# Patient Record
Sex: Female | Born: 1990 | Race: Black or African American | Hispanic: No | Marital: Single | Smoking: Never smoker
Health system: Southern US, Community
[De-identification: ages and names within clinical notes are randomized; demographics above are authoritative.]

## PROBLEM LIST (undated history)

## (undated) DIAGNOSIS — J45909 Unspecified asthma, uncomplicated: Secondary | ICD-10-CM

## (undated) HISTORY — PX: TONSILLECTOMY: SUR1361

---
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
## 2012-03-14 ENCOUNTER — Emergency Department (HOSPITAL_COMMUNITY)
Admission: EM | Admit: 2012-03-14 | Discharge: 2012-03-14 | Disposition: A | Discharge status: Home / Self Care | Payer: Medicaid Other | Attending: Emergency Medicine | Admitting: Emergency Medicine

## 2012-03-14 DIAGNOSIS — N390 Urinary tract infection, site not specified: Secondary | ICD-10-CM | POA: Insufficient documentation

## 2012-03-14 DIAGNOSIS — R109 Unspecified abdominal pain: Secondary | ICD-10-CM

## 2012-03-14 LAB — URINALYSIS, ROUTINE W REFLEX MICROSCOPIC
Bilirubin Urine: NEGATIVE
Glucose, UA: NEGATIVE mg/dL
Hgb urine dipstick: NEGATIVE
Specific Gravity, Urine: 1.021 (ref 1.005–1.030)
Urobilinogen, UA: 1 mg/dL (ref 0.0–1.0)

## 2012-03-14 LAB — URINE MICROSCOPIC-ADD ON

## 2012-03-14 LAB — POCT PREGNANCY, URINE: Preg Test, Ur: NEGATIVE

## 2012-03-14 MED ORDER — CEPHALEXIN 500 MG PO CAPS
500.0000 mg | ORAL_CAPSULE | Freq: Once | ORAL | Status: AC
Start: 2012-03-14 — End: 2012-03-14
  Administered 2012-03-14: 500 mg via ORAL
  Filled 2012-03-14: qty 1

## 2012-03-14 MED ORDER — HYDROCODONE-ACETAMINOPHEN 5-325 MG PO TABS: 1.0000 | ORAL_TABLET | ORAL | Status: AC | PRN

## 2012-03-14 MED ORDER — OXYCODONE-ACETAMINOPHEN 5-325 MG PO TABS
1.0000 | ORAL_TABLET | Freq: Once | ORAL | Status: AC
  Administered 2012-03-14: 1 via ORAL
  Filled 2012-03-14: qty 1

## 2012-03-14 MED ORDER — CEPHALEXIN 500 MG PO CAPS: 500.0000 mg | ORAL_CAPSULE | Freq: Four times a day (QID) | ORAL | Status: AC

## 2012-03-14 NOTE — ED Provider Notes (Signed)
History     CSN: [id number]  Arrival date & time 03/14/12  0224   First MD Initiated Contact with Patient 03/14/12 0319      Chief Complaint  Patient presents with  . Abdominal Pain    The history is provided by the patient.   patient reports approximately 7 days of suprapubic abdominal pain with urinary frequency.  She denies dysuria.  She has no flank pain.  She denies fevers or chills.  She has no nausea vomiting or diarrhea.  She reports her pain is been constant and moderate in severity.  Nothing worsens or improves her symptoms.  She denies vaginal discharge or abnormal vaginal bleeding.  Her last menstrual period was approximately one month ago.  She is unsure if she is pregnant or not.  No past medical history on file.  No past surgical history on file.  No family history on file.  History  Substance Use Topics  . Smoking status: Not on file  . Smokeless tobacco: Not on file  . Alcohol Use: Not on file    OB History    No data available      Review of Systems  Gastrointestinal: Positive for abdominal pain.  All other systems reviewed and are negative.    Allergies  Review of patient's allergies indicates no known allergies.  Home Medications   Current Outpatient Rx  Name Route Sig Dispense Refill  . ALBUTEROL SULFATE HFA 108 (90 BASE) MCG/ACT IN AERS Inhalation Inhale 2 puffs into the lungs every 6 (six) hours as needed. Shortness of breath    . CETIRIZINE HCL 10 MG PO TABS Oral Take 10 mg by mouth daily.    . CEPHALEXIN 500 MG PO CAPS Oral Take 1 capsule (500 mg total) by mouth 4 (four) times daily. 20 capsule 0  . HYDROCODONE-ACETAMINOPHEN 5-325 MG PO TABS Oral Take 1 tablet by mouth every 4 (four) hours as needed for pain. 15 tablet 0    BP 121/72  Pulse 84  Temp 98.2 F (36.8 C) (Oral)  Resp 16  Ht 5\' 3"  (1.6 m)  Wt 170 lb (77.111 kg)  BMI 30.11 kg/m2  SpO2 98%  Physical Exam  Nursing note and vitals reviewed. Constitutional: She is  oriented to person, place, and time. She appears well-developed and well-nourished. No distress.  HENT:  Head: Normocephalic and atraumatic.  Eyes: EOM are normal.  Neck: Normal range of motion.  Cardiovascular: Normal rate, regular rhythm and normal heart sounds.   Pulmonary/Chest: Effort normal and breath sounds normal.  Abdominal: Soft. She exhibits no distension. There is no tenderness. There is no rebound and no guarding.  Musculoskeletal: Normal range of motion.  Neurological: She is alert and oriented to person, place, and time.  Skin: Skin is warm and dry.  Psychiatric: She has a normal mood and affect. Judgment normal.    ED Course  Procedures (including critical care time)  Labs Reviewed  URINALYSIS, ROUTINE W REFLEX MICROSCOPIC - Abnormal; Notable for the following:    APPearance CLOUDY (*)     Leukocytes, UA SMALL (*)     All other components within normal limits  URINE MICROSCOPIC-ADD ON - Abnormal; Notable for the following:    Squamous Epithelial / LPF FEW (*)     All other components within normal limits  POCT PREGNANCY, URINE  URINE CULTURE   No results found.   1. Abdominal pain   2. Urinary tract infection       MDM  The patient is well-appearing.  She is nontender on exam at this time.  Her urine is cloudy with some leukocytes and a few bacteria.  This may represent urinary tract infection as the patient will be covered with Keflex.  She understands return the ER in 48 hours for recheck of her abdomen.  Her pain is been going on for 1 week and she's not tender in her right lower quadrant she has no fever.  I doubt this is appendicitis.        Lyanne Co, MD 03/14/12 [phone]

## 2012-03-14 NOTE — ED Notes (Signed)
WUJ:[id number]<JY> Expected date:03/14/12<BR> Expected time: 2:01 AM<BR> Means of arrival:Ambulance<BR> Comments:<BR> abd pain

## 2012-03-14 NOTE — ED Notes (Signed)
Complaints of abdominal pain that worsened within the past week. Pain goes from the left to right side. Pt states she just had her period and does not know she is pregnant.

## 2012-03-15 LAB — URINE CULTURE

## 2012-03-28 ENCOUNTER — Encounter (HOSPITAL_COMMUNITY): Payer: Self-pay | Admitting: Family Medicine

## 2012-03-28 ENCOUNTER — Emergency Department (HOSPITAL_COMMUNITY): Payer: Medicaid Other

## 2012-03-28 ENCOUNTER — Emergency Department (HOSPITAL_COMMUNITY)
Admission: EM | Admit: 2012-03-28 | Discharge: 2012-03-28 | Discharge status: Against Medical Advice | Payer: Medicaid Other | Attending: Emergency Medicine | Admitting: Emergency Medicine

## 2012-03-28 DIAGNOSIS — R109 Unspecified abdominal pain: Secondary | ICD-10-CM | POA: Insufficient documentation

## 2012-03-28 HISTORY — DX: Unspecified asthma, uncomplicated: J45.909

## 2012-03-28 LAB — URINE MICROSCOPIC-ADD ON

## 2012-03-28 LAB — URINALYSIS, ROUTINE W REFLEX MICROSCOPIC
Bilirubin Urine: NEGATIVE
Glucose, UA: NEGATIVE mg/dL
Hgb urine dipstick: NEGATIVE
Specific Gravity, Urine: 1.026 (ref 1.005–1.030)
Urobilinogen, UA: 2 mg/dL — ABNORMAL HIGH (ref 0.0–1.0)
pH: 6 (ref 5.0–8.0)

## 2012-03-28 LAB — POCT PREGNANCY, URINE: Preg Test, Ur: NEGATIVE

## 2012-03-28 NOTE — ED Notes (Signed)
Pt states she had to leave to pick up her son and she would come back later. NAD noted.

## 2012-03-28 NOTE — ED Notes (Signed)
Patient states that she has been having pain underneath her breasts x 2 days. States that she has taken Hydrocodone without relief. Pain with deep inspiration.

## 2012-06-19 ENCOUNTER — Emergency Department (HOSPITAL_COMMUNITY)
Admission: EM | Admit: 2012-06-19 | Discharge: 2012-06-19 | Disposition: A | Discharge status: Home / Self Care | Payer: Medicaid Other | Attending: Emergency Medicine | Admitting: Emergency Medicine

## 2012-06-19 DIAGNOSIS — B009 Herpesviral infection, unspecified: Secondary | ICD-10-CM | POA: Insufficient documentation

## 2012-06-19 DIAGNOSIS — Z79899 Other long term (current) drug therapy: Secondary | ICD-10-CM | POA: Insufficient documentation

## 2012-06-19 DIAGNOSIS — Z3202 Encounter for pregnancy test, result negative: Secondary | ICD-10-CM | POA: Insufficient documentation

## 2012-06-19 DIAGNOSIS — B001 Herpesviral vesicular dermatitis: Secondary | ICD-10-CM

## 2012-06-19 DIAGNOSIS — J45909 Unspecified asthma, uncomplicated: Secondary | ICD-10-CM | POA: Insufficient documentation

## 2012-06-19 MED ORDER — IBUPROFEN 800 MG PO TABS: 800.0000 mg | ORAL_TABLET | Freq: Three times a day (TID) | ORAL | Status: DC | PRN

## 2012-06-19 MED ORDER — VALACYCLOVIR HCL 1 G PO TABS: 2000.0000 mg | ORAL_TABLET | Freq: Two times a day (BID) | ORAL | Status: AC

## 2012-06-19 MED ORDER — IBUPROFEN 800 MG PO TABS
800.0000 mg | ORAL_TABLET | Freq: Once | ORAL | Status: AC
  Administered 2012-06-19: 800 mg via ORAL
  Filled 2012-06-19: qty 1

## 2012-06-19 MED ORDER — MUPIROCIN CALCIUM 2 % EX CREA: TOPICAL_CREAM | Freq: Three times a day (TID) | CUTANEOUS | Status: DC

## 2012-06-19 NOTE — ED Provider Notes (Signed)
Medical screening examination/treatment/procedure(s) were performed by non-physician practitioner and as supervising physician I was immediately available for consultation/collaboration.   Adaiah Jaskot, MD 06/19/12 2217 

## 2012-06-19 NOTE — ED Notes (Addendum)
Pt states that she began having a rash/lesion on her lip two days ago. Pt states that the lesion is painful. Pt also states that she has not had a period since September.

## 2012-06-19 NOTE — ED Provider Notes (Signed)
History     CSN: [id number]  Arrival date & time 06/19/12  1554   First MD Initiated Contact with Patient 06/19/12 1751      Chief Complaint  Patient presents with  . Rash on Lip     (Consider location/radiation/quality/duration/timing/severity/associated sxs/prior treatment) HPI The patient presents to the ED with a 3 day history of an oral rash.  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  She describes the rash as painfulDenies fever, congestion, sore throat, sinus pain, cough. Denies new oral sexual partners.  Past Medical History  Diagnosis Date  . Asthma     Past Surgical History  Procedure Date  . Tonsillectomy     No family history on file.  History  Substance Use Topics  . Smoking status: Never Smoker   . Smokeless tobacco: Not on file  . Alcohol Use: No    OB History    Grav Para Term Preterm Abortions TAB SAB Ect Mult Living                  Review of Systems All other systems negative except as documented in the HPI. All pertinent positives and negatives as reviewed in the HPI.  Allergies  Review of patient's allergies indicates no known allergies.  Home Medications   Current Outpatient Rx  Name  Route  Sig  Dispense  Refill  . ALBUTEROL SULFATE HFA 108 (90 BASE) MCG/ACT IN AERS   Inhalation   Inhale 2 puffs into the lungs every 6 (six) hours as needed. Shortness of breath         . CETIRIZINE HCL 10 MG PO TABS   Oral   Take 10 mg by mouth daily.           BP 125/68  Pulse 78  Temp 97.8 F (36.6 C) (Oral)  Resp 12  SpO2 97%  LMP 04/03/2012  Physical Exam  HENT:  Mouth/Throat: Mucous membranes are not pale and not dry. Oral lesions present.         1.5cmx1.5cm circular ulcerated lesion R [place] with a 0.5cmx 0.5cm cental scab.  Smaller ulcerated lesion on R corner.  Tender to palpation.      ED Course  Procedures (including critical care time)   Labs Reviewed  PREGNANCY, URINE  Discussed OTC oral HSV  treatments with patient.    MDM         Carlyle Dolly, PA-C 06/19/12 1838

## 2012-08-10 ENCOUNTER — Emergency Department (INDEPENDENT_AMBULATORY_CARE_PROVIDER_SITE_OTHER)
Admission: EM | Admit: 2012-08-10 | Discharge: 2012-08-10 | Disposition: A | Discharge status: Home / Self Care | Payer: Medicaid Other | Source: Home / Self Care | Attending: Emergency Medicine | Admitting: Emergency Medicine

## 2012-08-10 ENCOUNTER — Encounter (HOSPITAL_COMMUNITY): Payer: Self-pay | Admitting: Emergency Medicine

## 2012-08-10 DIAGNOSIS — K529 Noninfective gastroenteritis and colitis, unspecified: Secondary | ICD-10-CM

## 2012-08-10 DIAGNOSIS — N912 Amenorrhea, unspecified: Secondary | ICD-10-CM

## 2012-08-10 DIAGNOSIS — K5289 Other specified noninfective gastroenteritis and colitis: Secondary | ICD-10-CM

## 2012-08-10 LAB — CBC WITH DIFFERENTIAL/PLATELET
Basophils Absolute: 0 10*3/uL (ref 0.0–0.1)
Basophils Relative: 0 % (ref 0–1)
Eosinophils Absolute: 0.1 10*3/uL (ref 0.0–0.7)
Hemoglobin: 13.4 g/dL (ref 12.0–15.0)
MCH: 29.2 pg (ref 26.0–34.0)
MCHC: 33.5 g/dL (ref 30.0–36.0)
Monocytes Relative: 7 % (ref 3–12)
Neutro Abs: 3.2 10*3/uL (ref 1.7–7.7)
Neutrophils Relative %: 45 % (ref 43–77)
Platelets: 203 10*3/uL (ref 150–400)

## 2012-08-10 LAB — POCT URINALYSIS DIP (DEVICE)
Glucose, UA: NEGATIVE mg/dL
Ketones, ur: NEGATIVE mg/dL
Nitrite: NEGATIVE

## 2012-08-10 LAB — POCT PREGNANCY, URINE: Preg Test, Ur: NEGATIVE

## 2012-08-10 MED ORDER — GI COCKTAIL ~~LOC~~
ORAL | Status: AC
  Filled 2012-08-10: qty 30

## 2012-08-10 MED ORDER — GI COCKTAIL ~~LOC~~
30.0000 mL | Freq: Once | ORAL | Status: AC
  Administered 2012-08-10: 30 mL via ORAL

## 2012-08-10 MED ORDER — ONDANSETRON HCL 8 MG PO TABS: 8.0000 mg | ORAL_TABLET | Freq: Three times a day (TID) | ORAL | Status: DC | PRN

## 2012-08-10 MED ORDER — DICYCLOMINE HCL 20 MG PO TABS: 20.0000 mg | ORAL_TABLET | Freq: Four times a day (QID) | ORAL | Status: DC

## 2012-08-10 NOTE — ED Provider Notes (Signed)
Chief Complaint  Patient presents with  . Abdominal Pain    History of Present Illness:   The patient is a 22 year old female who has had a two-day history of generalized, moderate abdominal pain. This feels like a stabbing sensation and it comes and goes. It does not radiate. Nothing including eating makes it worse. It was better when she drank some Sprite. It is rated 5/10 in intensity. She's felt nauseated and vomited a couple times and had frequent loose stools, about 15 at all since yesterday. There's been no blood in the stool, no urinary symptoms, no GYN complaints. Her mother had a similar syndrome prior to her getting sick. She's had no suspicious ingestions, foreign travel, or animal exposure. Her last menstrual period was in June 2013. The patient is sexually active without use of birth control. She states her menses always been irregular.  Review of Systems:  Other than noted above, the patient denies any of the following symptoms: Systemic:  No fevers, chills, sweats, weight loss or gain, fatigue, or tiredness. ENT:  No nasal congestion, rhinorrhea, or sore throat. Lungs:  No cough, wheezing, or shortness of breath. Cardiac:  No chest pain, syncope, or presyncope. GI:  No abdominal pain, nausea, vomiting, anorexia, diarrhea, constipation, blood in stool or vomitus. GU:  No dysuria, frequency, or urgency.  PMFSH:  Past medical history, family history, social history, meds, and allergies were reviewed.  Physical Exam:   Vital signs:  BP 103/68  Pulse 86  Temp 97.6 F (36.4 C) (Oral)  Resp 16  SpO2 98%  LMP 12/31/2011 General:  Alert and oriented.  In no distress.  Skin warm and dry.  Good skin turgor, brisk capillary refill. ENT:  No scleral icterus, moist mucous membranes, no oral lesions, pharynx clear. Lungs:  Breath sounds clear and equal bilaterally.  No wheezes, rales, or rhonchi. Heart:  Rhythm regular, without extrasystoles.  No gallops or murmers. Abdomen:  Abdomen  was soft, flat, with mild, generalized tenderness to palpation without guarding or rebound. No organomegaly or mass. Bowel sounds are hyperactive. Skin: Clear, warm, and dry.  Good turgor.  Brisk capillary refill.  Labs:   Results for orders placed during the hospital encounter of 08/10/12  CBC WITH DIFFERENTIAL      Component Value Range   WBC 7.0  4.0 - 10.5 K/uL   RBC 4.59  3.87 - 5.11 MIL/uL   Hemoglobin 13.4  12.0 - 15.0 g/dL   HCT 40.9  81.1 - 91.4 %   MCV 87.1  78.0 - 100.0 fL   MCH 29.2  26.0 - 34.0 pg   MCHC 33.5  30.0 - 36.0 g/dL   RDW 78.2  95.6 - 21.3 %   Platelets 203  150 - 400 K/uL   Neutrophils Relative 45  43 - 77 %   Neutro Abs 3.2  1.7 - 7.7 K/uL   Lymphocytes Relative 46  12 - 46 %   Lymphs Abs 3.2  0.7 - 4.0 K/uL   Monocytes Relative 7  3 - 12 %   Monocytes Absolute 0.5  0.1 - 1.0 K/uL   Eosinophils Relative 2  0 - 5 %   Eosinophils Absolute 0.1  0.0 - 0.7 K/uL   Basophils Relative 0  0 - 1 %   Basophils Absolute 0.0  0.0 - 0.1 K/uL  POCT URINALYSIS DIP (DEVICE)      Component Value Range   Glucose, UA NEGATIVE  NEGATIVE mg/dL   Bilirubin Urine SMALL (*)  NEGATIVE   Ketones, ur NEGATIVE  NEGATIVE mg/dL   Specific Gravity, Urine 1.025  1.005 - 1.030   Hgb urine dipstick NEGATIVE  NEGATIVE   pH 6.5  5.0 - 8.0   Protein, ur 30 (*) NEGATIVE mg/dL   Urobilinogen, UA 1.0  0.0 - 1.0 mg/dL   Nitrite NEGATIVE  NEGATIVE   Leukocytes, UA TRACE (*) NEGATIVE  POCT PREGNANCY, URINE      Component Value Range   Preg Test, Ur NEGATIVE  NEGATIVE     Course in Urgent Care Center:   She was given 30 mL of GI cocktail and experienced improvement of the pain after that. She did not have any nausea or vomiting while she was at the Urgent Care Center.  Assessment:  The primary encounter diagnosis was Gastroenteritis. A diagnosis of Amenorrhea was also pertinent to this visit.  Plan:   1.  The following meds were prescribed:   New Prescriptions   DICYCLOMINE (BENTYL) 20  MG TABLET    Take 1 tablet (20 mg total) by mouth every 6 (six) hours.   ONDANSETRON (ZOFRAN) 8 MG TABLET    Take 1 tablet (8 mg total) by mouth every 8 (eight) hours as needed for nausea.   2.  The patient was instructed in symptomatic care and handouts were given. 3.  The patient was told to return if becoming worse in any way, if no better in 2 or 3 days, and given some red flag symptoms that would indicate earlier return. 4.  The patient was told to take only sips of clear liquids for the next 24 hours and then advance to a b.r.a.t. Diet.      Reuben Likes, MD 08/10/12 [phone]

## 2012-08-10 NOTE — ED Notes (Signed)
Pt c/o abd pain since yest night Sx include: diarrhea and vomiting Denies: fevers, nauseas  She is alert and responsive w/no signs of acute distress.

## 2012-10-01 ENCOUNTER — Encounter (HOSPITAL_COMMUNITY): Payer: Self-pay | Admitting: Emergency Medicine

## 2012-10-01 ENCOUNTER — Emergency Department (HOSPITAL_COMMUNITY): Payer: Medicaid Other

## 2012-10-01 ENCOUNTER — Emergency Department (HOSPITAL_COMMUNITY)
Admission: EM | Admit: 2012-10-01 | Discharge: 2012-10-02 | Disposition: A | Discharge status: Home / Self Care | Payer: Medicaid Other | Attending: Emergency Medicine | Admitting: Emergency Medicine

## 2012-10-01 DIAGNOSIS — R6883 Chills (without fever): Secondary | ICD-10-CM | POA: Insufficient documentation

## 2012-10-01 DIAGNOSIS — R1110 Vomiting, unspecified: Secondary | ICD-10-CM | POA: Insufficient documentation

## 2012-10-01 DIAGNOSIS — Z3202 Encounter for pregnancy test, result negative: Secondary | ICD-10-CM | POA: Insufficient documentation

## 2012-10-01 DIAGNOSIS — J159 Unspecified bacterial pneumonia: Secondary | ICD-10-CM | POA: Insufficient documentation

## 2012-10-01 DIAGNOSIS — J189 Pneumonia, unspecified organism: Secondary | ICD-10-CM

## 2012-10-01 DIAGNOSIS — Z79899 Other long term (current) drug therapy: Secondary | ICD-10-CM | POA: Insufficient documentation

## 2012-10-01 DIAGNOSIS — J45909 Unspecified asthma, uncomplicated: Secondary | ICD-10-CM | POA: Insufficient documentation

## 2012-10-01 DIAGNOSIS — IMO0001 Reserved for inherently not codable concepts without codable children: Secondary | ICD-10-CM | POA: Insufficient documentation

## 2012-10-01 LAB — COMPREHENSIVE METABOLIC PANEL
ALT: 15 U/L (ref 0–35)
AST: 14 U/L (ref 0–37)
Alkaline Phosphatase: 50 U/L (ref 39–117)
CO2: 25 mEq/L (ref 19–32)
Calcium: 9 mg/dL (ref 8.4–10.5)
Potassium: 3.9 mEq/L (ref 3.5–5.1)
Sodium: 138 mEq/L (ref 135–145)
Total Protein: 8.9 g/dL — ABNORMAL HIGH (ref 6.0–8.3)

## 2012-10-01 LAB — CBC WITH DIFFERENTIAL/PLATELET
Basophils Absolute: 0 10*3/uL (ref 0.0–0.1)
Eosinophils Absolute: 0.3 10*3/uL (ref 0.0–0.7)
Eosinophils Relative: 3 % (ref 0–5)
Lymphocytes Relative: 37 % (ref 12–46)
Lymphs Abs: 3.9 10*3/uL (ref 0.7–4.0)
MCV: 87 fL (ref 78.0–100.0)
Neutrophils Relative %: 54 % (ref 43–77)
Platelets: 194 10*3/uL (ref 150–400)
RBC: 4.38 MIL/uL (ref 3.87–5.11)
RDW: 13 % (ref 11.5–15.5)
WBC: 10.6 10*3/uL — ABNORMAL HIGH (ref 4.0–10.5)

## 2012-10-01 LAB — URINE MICROSCOPIC-ADD ON

## 2012-10-01 LAB — URINALYSIS, ROUTINE W REFLEX MICROSCOPIC
Hgb urine dipstick: NEGATIVE
Nitrite: NEGATIVE
Protein, ur: NEGATIVE mg/dL
Specific Gravity, Urine: 1.034 — ABNORMAL HIGH (ref 1.005–1.030)
Urobilinogen, UA: 2 mg/dL — ABNORMAL HIGH (ref 0.0–1.0)

## 2012-10-01 MED ORDER — ALBUTEROL SULFATE (5 MG/ML) 0.5% IN NEBU
2.5000 mg | INHALATION_SOLUTION | RESPIRATORY_TRACT | Status: DC
  Administered 2012-10-01: 2.5 mg via RESPIRATORY_TRACT
  Filled 2012-10-01: qty 0.5

## 2012-10-01 MED ORDER — IPRATROPIUM BROMIDE 0.02 % IN SOLN
0.5000 mg | RESPIRATORY_TRACT | Status: DC
  Administered 2012-10-01: 0.5 mg via RESPIRATORY_TRACT
  Filled 2012-10-01: qty 2.5

## 2012-10-01 NOTE — ED Notes (Signed)
Pt c/o productive cough (green) with pain in her chest when she coughs.  Also st's she has vomited x's 1 today.

## 2012-10-01 NOTE — ED Notes (Signed)
Patient transported to X-ray 

## 2012-10-01 NOTE — ED Provider Notes (Signed)
History     CSN: [id number]  Arrival date & time 10/01/12  2215   First MD Initiated Contact with Patient 10/01/12 2241      Chief Complaint  Patient presents with  . Cough    (Consider location/radiation/quality/duration/timing/severity/associated sxs/prior treatment) HPI  22 year old female presents emergency Department with chief complaint of productive cough and one episode of posttussive vomiting today.patient states she has had cough with production of green sputum for the past 3 days.  She's also had associated myalgias and chills.  She has not had a fever.  The patient denies any contacts with similar symptoms.  He does have a distant history of asthma with hospitalization but denies history of intubation.  Patient states that she has pain with deep inhalation anticoagulation.  She has had some wheezing and has not had an albuterol inhaler for several years or needed one.  The patient is a nonsmoker  Past Medical History  Diagnosis Date  . Asthma     Past Surgical History  Procedure Laterality Date  . Tonsillectomy      No family history on file.  History  Substance Use Topics  . Smoking status: Never Smoker   . Smokeless tobacco: Not on file  . Alcohol Use: No    OB History   Grav Para Term Preterm Abortions TAB SAB Ect Mult Living                  Review of Systems Ten systems reviewed and are negative for acute change, except as noted in the HPI.   Allergies  Review of patient's allergies indicates no known allergies.  Home Medications   Current Outpatient Rx  Name  Route  Sig  Dispense  Refill  . albuterol (PROVENTIL HFA;VENTOLIN HFA) 108 (90 BASE) MCG/ACT inhaler   Inhalation   Inhale 2 puffs into the lungs every 6 (six) hours as needed. Shortness of breath         . cetirizine (ZYRTEC) 10 MG tablet   Oral   Take 10 mg by mouth daily.           BP 118/68  Pulse 85  Temp(Src) 98.3 F (36.8 C) (Oral)  Resp 18  SpO2 97%  LMP  08/22/2012  Physical Exam Physical Exam  Nursing note and vitals reviewed. Constitutional: She is oriented to person, place, and time. She appears well-developed and well-nourished. She appears ill. HENT:  Head: Normocephalic and atraumatic.  Eyes: Conjunctivae normal and EOM are normal. Pupils are equal, round, and reactive to light. No scleral icterus.  Neck: Normal range of motion. No cervical adenopathy Cardiovascular: Normal rate, regular rhythm and normal heart sounds.  Exam reveals no gallop and no friction rub.   No murmur heard. Pulmonary/Chest: Effort normal. Paroxysms of cough.  Production purulent sputum.  Wheezing and increased breath sounds over the right upper lobe. Abdominal: Soft. Bowel sounds are normal. She exhibits no distension and no mass. There is no tenderness. There is no guarding.  Neurological: She is alert and oriented to person, place, and time.  Skin: Skin is warm and dry. She is not diaphoretic.    ED Course  Procedures (including critical care time)  Labs Reviewed  CBC WITH DIFFERENTIAL - Abnormal; Notable for the following:    WBC 10.6 (*)    All other components within normal limits  URINALYSIS, ROUTINE W REFLEX MICROSCOPIC  COMPREHENSIVE METABOLIC PANEL  POCT PREGNANCY, URINE   Dg Chest 2 View  10/01/2012  *RADIOLOGY REPORT*  Clinical Data: Short of breath, vomiting  CHEST - 2 VIEW  Comparison: Chest radiograph 03/20/2012  Findings: Normal cardiac silhouette.  There is new air space disease in the central right upper lobe.  No pleural fluid.  No pneumothorax.  IMPRESSION: Right upper lobe pneumonia.  Recommend follow-up chest radiograph to ensure resolution.   Original Report Authenticated By: Genevive Bi, M.D.      1. CAP (community acquired pneumonia)       MDM  11:33 PM BP 118/68  Pulse 85  Temp(Src) 98.3 F (36.8 C) (Oral)  Resp 18  SpO2 97%  LMP 08/22/2012 Patient with chest x-ray that shows right upper lobe pneumonia.  Other  labs are currently pending.  Patient does have some wheezing.  We'll treat with duo neb.   12:02 AM BP 118/68  Pulse 85  Temp(Src) 98.3 F (36.8 C) (Oral)  Resp 18  SpO2 97%  LMP 08/22/2012 Patient labs resulted.  Patient has elevated preoteinemia and leukocytosis.  Will d/c with oral abx for CAP treatment.  Curb-65 score is zero. Patient has been diagnosed with CAP via chest xray. Pt is not ill appearing, immunocompromised, and does not have multiple co morbidities, therefore I feel like the they can be treated as an OP with abx therapy. Pt has been advised to return to the ED if symptoms worsen or they do not improve. Pt verbalizes understanding and is agreeable with plan. Follow up for repeat chest x ray in 7 days,        Arthor Captain, PA-C 10/02/12 0017

## 2012-10-02 MED ORDER — AZITHROMYCIN 250 MG PO TABS: ORAL_TABLET | ORAL | Status: DC

## 2012-10-02 MED ORDER — HYDROCODONE-HOMATROPINE 5-1.5 MG/5ML PO SYRP: 2.5000 mL | ORAL_SOLUTION | Freq: Four times a day (QID) | ORAL | Status: DC | PRN

## 2012-10-02 MED ORDER — ALBUTEROL SULFATE HFA 108 (90 BASE) MCG/ACT IN AERS: 2.0000 | INHALATION_SPRAY | RESPIRATORY_TRACT | Status: DC | PRN

## 2012-10-03 LAB — URINE CULTURE

## 2012-10-03 NOTE — ED Provider Notes (Signed)
Medical screening examination/treatment/procedure(s) were performed by non-physician practitioner and as supervising physician I was immediately available for consultation/collaboration.   Celene Kras, MD 10/03/12 [phone]

## 2012-10-08 ENCOUNTER — Emergency Department (HOSPITAL_COMMUNITY): Payer: Medicaid Other

## 2012-10-08 ENCOUNTER — Emergency Department (HOSPITAL_COMMUNITY)
Admission: EM | Admit: 2012-10-08 | Discharge: 2012-10-09 | Disposition: A | Discharge status: Home / Self Care | Payer: Medicaid Other | Attending: Emergency Medicine | Admitting: Emergency Medicine

## 2012-10-08 ENCOUNTER — Encounter (HOSPITAL_COMMUNITY): Payer: Self-pay | Admitting: *Deleted

## 2012-10-08 DIAGNOSIS — J45909 Unspecified asthma, uncomplicated: Secondary | ICD-10-CM | POA: Insufficient documentation

## 2012-10-08 DIAGNOSIS — R05 Cough: Secondary | ICD-10-CM | POA: Insufficient documentation

## 2012-10-08 DIAGNOSIS — G510 Bell's palsy: Secondary | ICD-10-CM | POA: Insufficient documentation

## 2012-10-08 DIAGNOSIS — Z09 Encounter for follow-up examination after completed treatment for conditions other than malignant neoplasm: Secondary | ICD-10-CM | POA: Insufficient documentation

## 2012-10-08 DIAGNOSIS — Z79899 Other long term (current) drug therapy: Secondary | ICD-10-CM | POA: Insufficient documentation

## 2012-10-08 DIAGNOSIS — R059 Cough, unspecified: Secondary | ICD-10-CM | POA: Insufficient documentation

## 2012-10-08 NOTE — ED Notes (Signed)
Pt is unable to raise left eyebrow and left side mouth not working when smiles

## 2012-10-08 NOTE — ED Provider Notes (Signed)
History     CSN: [id number]  Arrival date & time 10/08/12  1717   First MD Initiated Contact with Patient 10/08/12 2237      Chief Complaint  Patient presents with  . Follow-up    (Consider location/radiation/quality/duration/timing/severity/associated sxs/prior treatment) HPI Comments: 22 y/o female presents to the ED for follow up CXR for her pneumonia diagnosed 7 days ago. She completed course of azithromycin. States her cough is still present however admits to marked improvement. Shortness of breath has improved. Denies fever, chills, nausea, vomiting, chest pain. Friday she began to experience inability to use the left side of her face with associated right sided facial numbness. Feels as if her left lip is "drawn up". Denies extremity weakness, speech changes, confusion, visual changes.   The history is provided by the patient.    Past Medical History  Diagnosis Date  . Asthma     Past Surgical History  Procedure Laterality Date  . Tonsillectomy      No family history on file.  History  Substance Use Topics  . Smoking status: Never Smoker   . Smokeless tobacco: Not on file  . Alcohol Use: No    OB History   Grav Para Term Preterm Abortions TAB SAB Ect Mult Living                  Review of Systems  Constitutional: Negative for fever and chills.  HENT: Negative for neck pain and neck stiffness.   Eyes: Negative for visual disturbance.  Respiratory: Positive for cough.   Cardiovascular: Negative for chest pain.  Gastrointestinal: Negative for nausea and vomiting.  Musculoskeletal: Negative for back pain.  Skin: Negative for color change.  Neurological: Positive for facial asymmetry.  Psychiatric/Behavioral: Negative for confusion.  All other systems reviewed and are negative.    Allergies  Review of patient's allergies indicates no known allergies.  Home Medications   Current Outpatient Rx  Name  Route  Sig  Dispense  Refill  . albuterol  (PROVENTIL HFA;VENTOLIN HFA) 108 (90 BASE) MCG/ACT inhaler   Inhalation   Inhale 2 puffs into the lungs every 6 (six) hours as needed. Shortness of breath           BP 107/59  Pulse 81  Temp(Src) 97.7 F (36.5 C) (Oral)  Resp 14  SpO2 100%  LMP 08/22/2012  Physical Exam  Nursing note and vitals reviewed. Constitutional: She is oriented to person, place, and time. She appears well-developed and well-nourished. No distress.  HENT:  Head: Normocephalic and atraumatic.  Right Ear: Tympanic membrane and ear canal normal.  Left Ear: Tympanic membrane and ear canal normal.  Nose: Nose normal.  Mouth/Throat: Uvula is midline, oropharynx is clear and moist and mucous membranes are normal.  Eyes: Conjunctivae and EOM are normal. Pupils are equal, round, and reactive to light.  Neck: Normal range of motion. Neck supple.  Cardiovascular: Normal rate, regular rhythm, normal heart sounds and intact distal pulses.   Pulmonary/Chest: Effort normal. She has no decreased breath sounds. She has rhonchi (mild ronchi in right upper/mid lung field).  Musculoskeletal: Normal range of motion. She exhibits no edema.  Neurological: She is alert and oriented to person, place, and time. She has normal strength. A cranial nerve deficit (motor CN VII deficit) is present. No sensory deficit.  Skin: Skin is warm and dry. She is not diaphoretic.  Psychiatric: She has a normal mood and affect. Her behavior is normal.    ED Course  Procedures (including critical care time)  Labs Reviewed - No data to display Dg Chest 2 View  10/09/2012  *RADIOLOGY REPORT*  Clinical Data: Follow up pneumonia; history asthma  CHEST - 2 VIEW  Comparison: 10/01/2012  Findings: Normal heart size, mediastinal contours, and pulmonary vascularity. Peribronchial thickening with improved right upper lobe infiltrate. Remaining lungs clear. No pleural effusion or pneumothorax. No acute osseous findings.  IMPRESSION: Bronchitic changes.  Improved right upper lobe infiltrate.   Original Report Authenticated By: Ulyses Southward, M.D.      1. Follow-up examination   2. Bell's palsy       MDM  Repeat CXR with improved RUL infiltrate. She is in NAD. Also with Bell's Palsy. CN VII motor deficit. No other focal neuro deficits. PE otherwise unremarkable. Rx prednisone and acyclovir. Discussed importance of keeping left eye moist and covered. Patient also evaluated by Dr. Silverio Lay who agrees with plan of care. Return precautions discussed. Resource guide given for PCP follow up. Patient states understanding of plan and is agreeable.         Trevor Mace, PA-C 10/09/12 0012

## 2012-10-08 NOTE — ED Notes (Signed)
Pt reports developing right side facial droop for past two days. States that she was diagnosed with PNA 7 days ago. States that she has had some difficulty swallowing and eating due to the right side facial droop. Pt states her mom thought pt could have Bell's Palsy and to come to the ER for further evaluation.

## 2012-10-08 NOTE — ED Notes (Signed)
Patient transported to X-ray 

## 2012-10-08 NOTE — ED Notes (Signed)
Pt here to have 7 day follow up of chest xray for pneumonia and pt states that her right side of  Lips are drawn up over the last 2 days.

## 2012-10-09 MED ORDER — PREDNISONE 20 MG PO TABS
60.0000 mg | ORAL_TABLET | Freq: Once | ORAL | Status: AC
  Administered 2012-10-09: 60 mg via ORAL
  Filled 2012-10-09: qty 3

## 2012-10-09 MED ORDER — ACYCLOVIR 200 MG PO CAPS
400.0000 mg | ORAL_CAPSULE | Freq: Once | ORAL | Status: AC
  Administered 2012-10-09: 400 mg via ORAL
  Filled 2012-10-09 (×2): qty 2

## 2012-10-09 MED ORDER — PREDNISONE 20 MG PO TABS: ORAL_TABLET | ORAL | Status: DC

## 2012-10-09 MED ORDER — ACYCLOVIR 400 MG PO TABS: 400.0000 mg | ORAL_TABLET | Freq: Four times a day (QID) | ORAL | Status: DC

## 2012-10-09 NOTE — ED Provider Notes (Signed)
Medical screening examination/treatment/procedure(s) were conducted as a shared visit with non-physician practitioner(s) and myself.  I personally evaluated the patient during the encounter  Jennifer Thornton is a 22 y.o. female here for f/u CXR for pneumonia last week. She finished Zpack. She also developed bells palsy L face. No rash or nose or ear lesions. She is d/c home on prednisone, acyclovir. She is instructed to keep eye taped or closed at night.    Richardean Canal, MD 10/09/12 [phone]

## 2012-10-09 NOTE — ED Notes (Addendum)
Waiting on medication from the pharmacy before discharging patient. 

## 2013-07-10 ENCOUNTER — Emergency Department (HOSPITAL_COMMUNITY)
Admission: EM | Admit: 2013-07-10 | Discharge: 2013-07-11 | Disposition: A | Discharge status: Home / Self Care | Payer: Medicaid Other | Attending: Emergency Medicine | Admitting: Emergency Medicine

## 2013-07-10 ENCOUNTER — Encounter (HOSPITAL_COMMUNITY): Payer: Self-pay | Admitting: Emergency Medicine

## 2013-07-10 DIAGNOSIS — N898 Other specified noninflammatory disorders of vagina: Secondary | ICD-10-CM | POA: Insufficient documentation

## 2013-07-10 DIAGNOSIS — N739 Female pelvic inflammatory disease, unspecified: Secondary | ICD-10-CM | POA: Insufficient documentation

## 2013-07-10 DIAGNOSIS — A599 Trichomoniasis, unspecified: Secondary | ICD-10-CM | POA: Insufficient documentation

## 2013-07-10 DIAGNOSIS — Z79899 Other long term (current) drug therapy: Secondary | ICD-10-CM | POA: Insufficient documentation

## 2013-07-10 DIAGNOSIS — J45909 Unspecified asthma, uncomplicated: Secondary | ICD-10-CM | POA: Insufficient documentation

## 2013-07-10 DIAGNOSIS — R110 Nausea: Secondary | ICD-10-CM | POA: Insufficient documentation

## 2013-07-10 DIAGNOSIS — Z3202 Encounter for pregnancy test, result negative: Secondary | ICD-10-CM | POA: Insufficient documentation

## 2013-07-10 DIAGNOSIS — N730 Acute parametritis and pelvic cellulitis: Secondary | ICD-10-CM

## 2013-07-10 DIAGNOSIS — Z792 Long term (current) use of antibiotics: Secondary | ICD-10-CM | POA: Insufficient documentation

## 2013-07-10 DIAGNOSIS — Z791 Long term (current) use of non-steroidal anti-inflammatories (NSAID): Secondary | ICD-10-CM | POA: Insufficient documentation

## 2013-07-10 LAB — CBC WITH DIFFERENTIAL/PLATELET
Basophils Absolute: 0 10*3/uL (ref 0.0–0.1)
Basophils Relative: 0 % (ref 0–1)
Eosinophils Absolute: 0.1 10*3/uL (ref 0.0–0.7)
HCT: 38.5 % (ref 36.0–46.0)
Hemoglobin: 13.1 g/dL (ref 12.0–15.0)
MCH: 30.3 pg (ref 26.0–34.0)
MCHC: 34 g/dL (ref 30.0–36.0)
MCV: 88.9 fL (ref 78.0–100.0)
Monocytes Absolute: 0.7 10*3/uL (ref 0.1–1.0)
Monocytes Relative: 6 % (ref 3–12)
Neutro Abs: 6.4 10*3/uL (ref 1.7–7.7)
Platelets: 226 10*3/uL (ref 150–400)

## 2013-07-10 LAB — COMPREHENSIVE METABOLIC PANEL
Albumin: 4 g/dL (ref 3.5–5.2)
BUN: 12 mg/dL (ref 6–23)
Chloride: 102 mEq/L (ref 96–112)
Creatinine, Ser: 0.59 mg/dL (ref 0.50–1.10)
GFR calc non Af Amer: >90 mL/min (ref >90)
Sodium: 137 mEq/L (ref 135–145)
Total Bilirubin: 0.2 mg/dL — ABNORMAL LOW (ref 0.3–1.2)

## 2013-07-10 LAB — URINALYSIS, ROUTINE W REFLEX MICROSCOPIC
Hgb urine dipstick: NEGATIVE
Ketones, ur: NEGATIVE mg/dL
Nitrite: NEGATIVE
Protein, ur: NEGATIVE mg/dL
Urobilinogen, UA: 0.2 mg/dL (ref 0.0–1.0)
pH: 7 (ref 5.0–8.0)

## 2013-07-10 LAB — LIPASE, BLOOD: Lipase: 21 U/L (ref 11–59)

## 2013-07-10 NOTE — ED Notes (Signed)
Pt. reports right / low abdominal pain with nausea and vomitting onset 2 days ago , denies diarrhea/fever or chills. No urinary discomfort or vaginal discharge.

## 2013-07-11 LAB — WET PREP, GENITAL

## 2013-07-11 LAB — GC/CHLAMYDIA PROBE AMP
CT Probe RNA: NEGATIVE
GC Probe RNA: POSITIVE — AB

## 2013-07-11 MED ORDER — AZITHROMYCIN 1 G PO PACK
1.0000 g | PACK | Freq: Once | ORAL | Status: AC
  Administered 2013-07-11: 1 g via ORAL
  Filled 2013-07-11: qty 1

## 2013-07-11 MED ORDER — CEFTRIAXONE SODIUM 250 MG IJ SOLR
250.0000 mg | Freq: Once | INTRAMUSCULAR | Status: AC
  Administered 2013-07-11: 250 mg via INTRAMUSCULAR
  Filled 2013-07-11: qty 250

## 2013-07-11 MED ORDER — METRONIDAZOLE 500 MG PO TABS
2000.0000 mg | ORAL_TABLET | Freq: Once | ORAL | Status: AC
  Administered 2013-07-11: 2000 mg via ORAL
  Filled 2013-07-11: qty 4

## 2013-07-11 MED ORDER — KETOROLAC TROMETHAMINE 60 MG/2ML IM SOLN
60.0000 mg | Freq: Once | INTRAMUSCULAR | Status: AC
  Administered 2013-07-11: 60 mg via INTRAMUSCULAR
  Filled 2013-07-11: qty 2

## 2013-07-11 MED ORDER — DOXYCYCLINE HYCLATE 100 MG PO CAPS: 100.0000 mg | ORAL_CAPSULE | Freq: Two times a day (BID) | ORAL | Status: DC

## 2013-07-11 MED ORDER — NAPROXEN 375 MG PO TABS: 375.0000 mg | ORAL_TABLET | Freq: Two times a day (BID) | ORAL | Status: DC

## 2013-07-11 NOTE — ED Notes (Signed)
Starting Friday she has been having pain to her right side.  Yesterday that pain got worse and she vomited once before coming here.  Denies any vaginal discharge at this time.

## 2013-07-11 NOTE — ED Provider Notes (Signed)
CSN: [id number]     Arrival date & time 07/10/13  2056 History   First MD Initiated Contact with Patient 07/11/13 0107     Chief Complaint  Patient presents with  . Abdominal Pain   (Consider location/radiation/quality/duration/timing/severity/associated sxs/prior Treatment) Patient is a 22 y.o. female presenting with abdominal pain. The history is provided by the patient.  Abdominal Pain Pain location:  Suprapubic Pain quality: aching   Pain radiates to:  Does not radiate Pain severity:  Severe Onset quality:  Gradual Timing:  Constant Progression:  Worsening Chronicity:  New Context: not suspicious food intake and not trauma   Relieved by:  Nothing Worsened by:  Nothing tried Ineffective treatments:  None tried Associated symptoms: nausea   Associated symptoms: no anorexia and no fever   Risk factors: not pregnant     Past Medical History  Diagnosis Date  . Asthma    Past Surgical History  Procedure Laterality Date  . Tonsillectomy     No family history on file. History  Substance Use Topics  . Smoking status: Never Smoker   . Smokeless tobacco: Not on file  . Alcohol Use: No   OB History   Grav Para Term Preterm Abortions TAB SAB Ect Mult Living                 Review of Systems  Constitutional: Negative for fever.  Gastrointestinal: Positive for nausea and abdominal pain. Negative for anorexia.  Genitourinary: Positive for pelvic pain.  All other systems reviewed and are negative.    Allergies  Review of patient's allergies indicates no known allergies.  Home Medications   Current Outpatient Rx  Name  Route  Sig  Dispense  Refill  . albuterol (PROVENTIL HFA;VENTOLIN HFA) 108 (90 BASE) MCG/ACT inhaler   Inhalation   Inhale 2 puffs into the lungs every 6 (six) hours as needed. Shortness of breath         . aspirin-acetaminophen-caffeine (EXCEDRIN MIGRAINE) 250-250-65 MG per tablet   Oral   Take 1 tablet by mouth 2 (two) times daily as needed  for headache.         . doxycycline (VIBRAMYCIN) 100 MG capsule   Oral   Take 1 capsule (100 mg total) by mouth 2 (two) times daily. One po bid x 14 days   28 capsule   0   . naproxen (NAPROSYN) 375 MG tablet   Oral   Take 1 tablet (375 mg total) by mouth 2 (two) times daily.   20 tablet   0    BP 115/68  Pulse 61  Temp(Src) 97.9 F (36.6 C) (Oral)  Resp 16  SpO2 98% Physical Exam  Constitutional: She is oriented to person, place, and time. She appears well-developed and well-nourished. No distress.  HENT:  Head: Normocephalic and atraumatic.  Mouth/Throat: Oropharynx is clear and moist.  Eyes: EOM are normal. Pupils are equal, round, and reactive to light.  Neck: Normal range of motion. Neck supple.  Cardiovascular: Normal rate and regular rhythm.   Pulmonary/Chest: Effort normal and breath sounds normal. She has no wheezes. She has no rales.  Abdominal: Soft. Bowel sounds are normal. There is no tenderness. There is no rebound and no guarding.  Genitourinary: Vaginal discharge found.  Frothy, consistent with trichomonas and CMT and left adnexal tenderness  Musculoskeletal: Normal range of motion.  Neurological: She is alert and oriented to person, place, and time.  Skin: Skin is warm and dry.  Psychiatric: She has a normal  mood and affect.    ED Course  Procedures (including critical care time) Labs Review Labs Reviewed  WET PREP, GENITAL - Abnormal; Notable for the following:    Trich, Wet Prep MODERATE (*)    Clue Cells Wet Prep HPF POC MANY (*)    WBC, Wet Prep HPF POC MANY (*)    All other components within normal limits  CBC WITH DIFFERENTIAL - Abnormal; Notable for the following:    WBC 10.7 (*)    All other components within normal limits  COMPREHENSIVE METABOLIC PANEL - Abnormal; Notable for the following:    Total Protein 8.6 (*)    Total Bilirubin 0.2 (*)    All other components within normal limits  URINALYSIS, ROUTINE W REFLEX MICROSCOPIC -  Abnormal; Notable for the following:    Leukocytes, UA SMALL (*)    All other components within normal limits  URINE MICROSCOPIC-ADD ON - Abnormal; Notable for the following:    Squamous Epithelial / LPF MANY (*)    Bacteria, UA FEW (*)    All other components within normal limits  GC/CHLAMYDIA PROBE AMP  LIPASE, BLOOD  PREGNANCY, URINE  PREGNANCY, URINE   Imaging Review No results found.  EKG Interpretation   None       MDM   1. Trichomoniasis   2. PID (acute pelvic inflammatory disease)    Treated for PID, recheck in 7 days by your GYN.  No sexual activity of any kind until 7 days after all partners treated.  Return for fevers inability to tolerate medications or food or any concerns    Gertrude Bucks K Makinsley Schiavi-Rasch, MD 07/11/13 0981

## 2013-07-12 ENCOUNTER — Telehealth (HOSPITAL_COMMUNITY): Payer: Self-pay | Admitting: Emergency Medicine

## 2013-07-12 NOTE — ED Notes (Signed)
+  Gonorrhea. Patient treated with Rocephin and Zithromax. [facility] faxed. 

## 2013-07-12 NOTE — ED Notes (Signed)
Patient has +Gonorrhea. °

## 2013-07-12 NOTE — ED Notes (Signed)
Unable to contact patient via phone. Sent letter. °

## 2013-07-17 ENCOUNTER — Encounter: Payer: Medicaid Other | Admitting: Obstetrics & Gynecology

## 2013-08-16 ENCOUNTER — Encounter: Payer: Self-pay | Admitting: Obstetrics & Gynecology

## 2013-08-19 IMAGING — CR DG CHEST 2V
2 series · 2 of 2 positions shown · non-contrast
Comparison: None.

CLINICAL DATA: Right side chest pain and shortness of breath.

CHEST - 2 VIEW

[w chest pa]
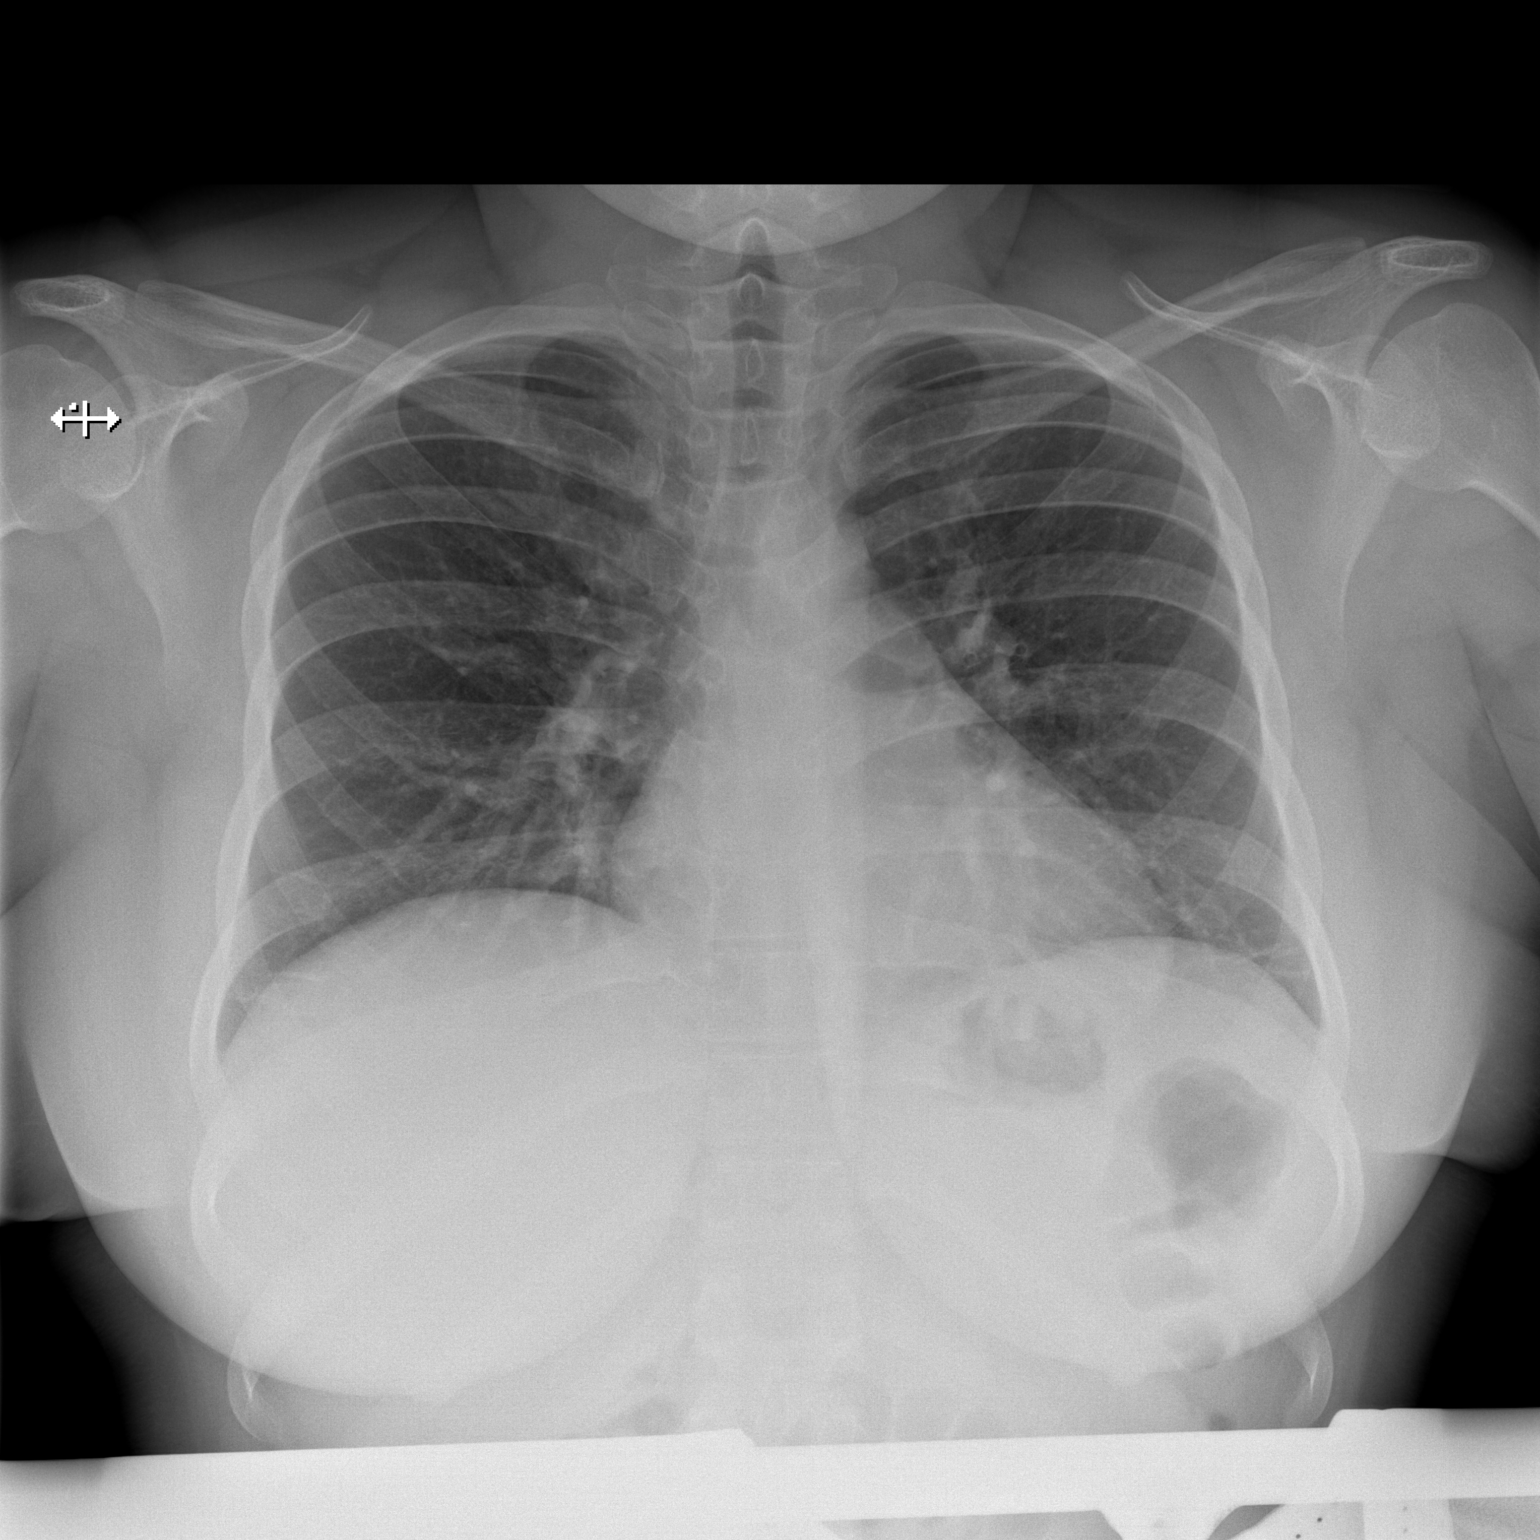

[w chest lat]
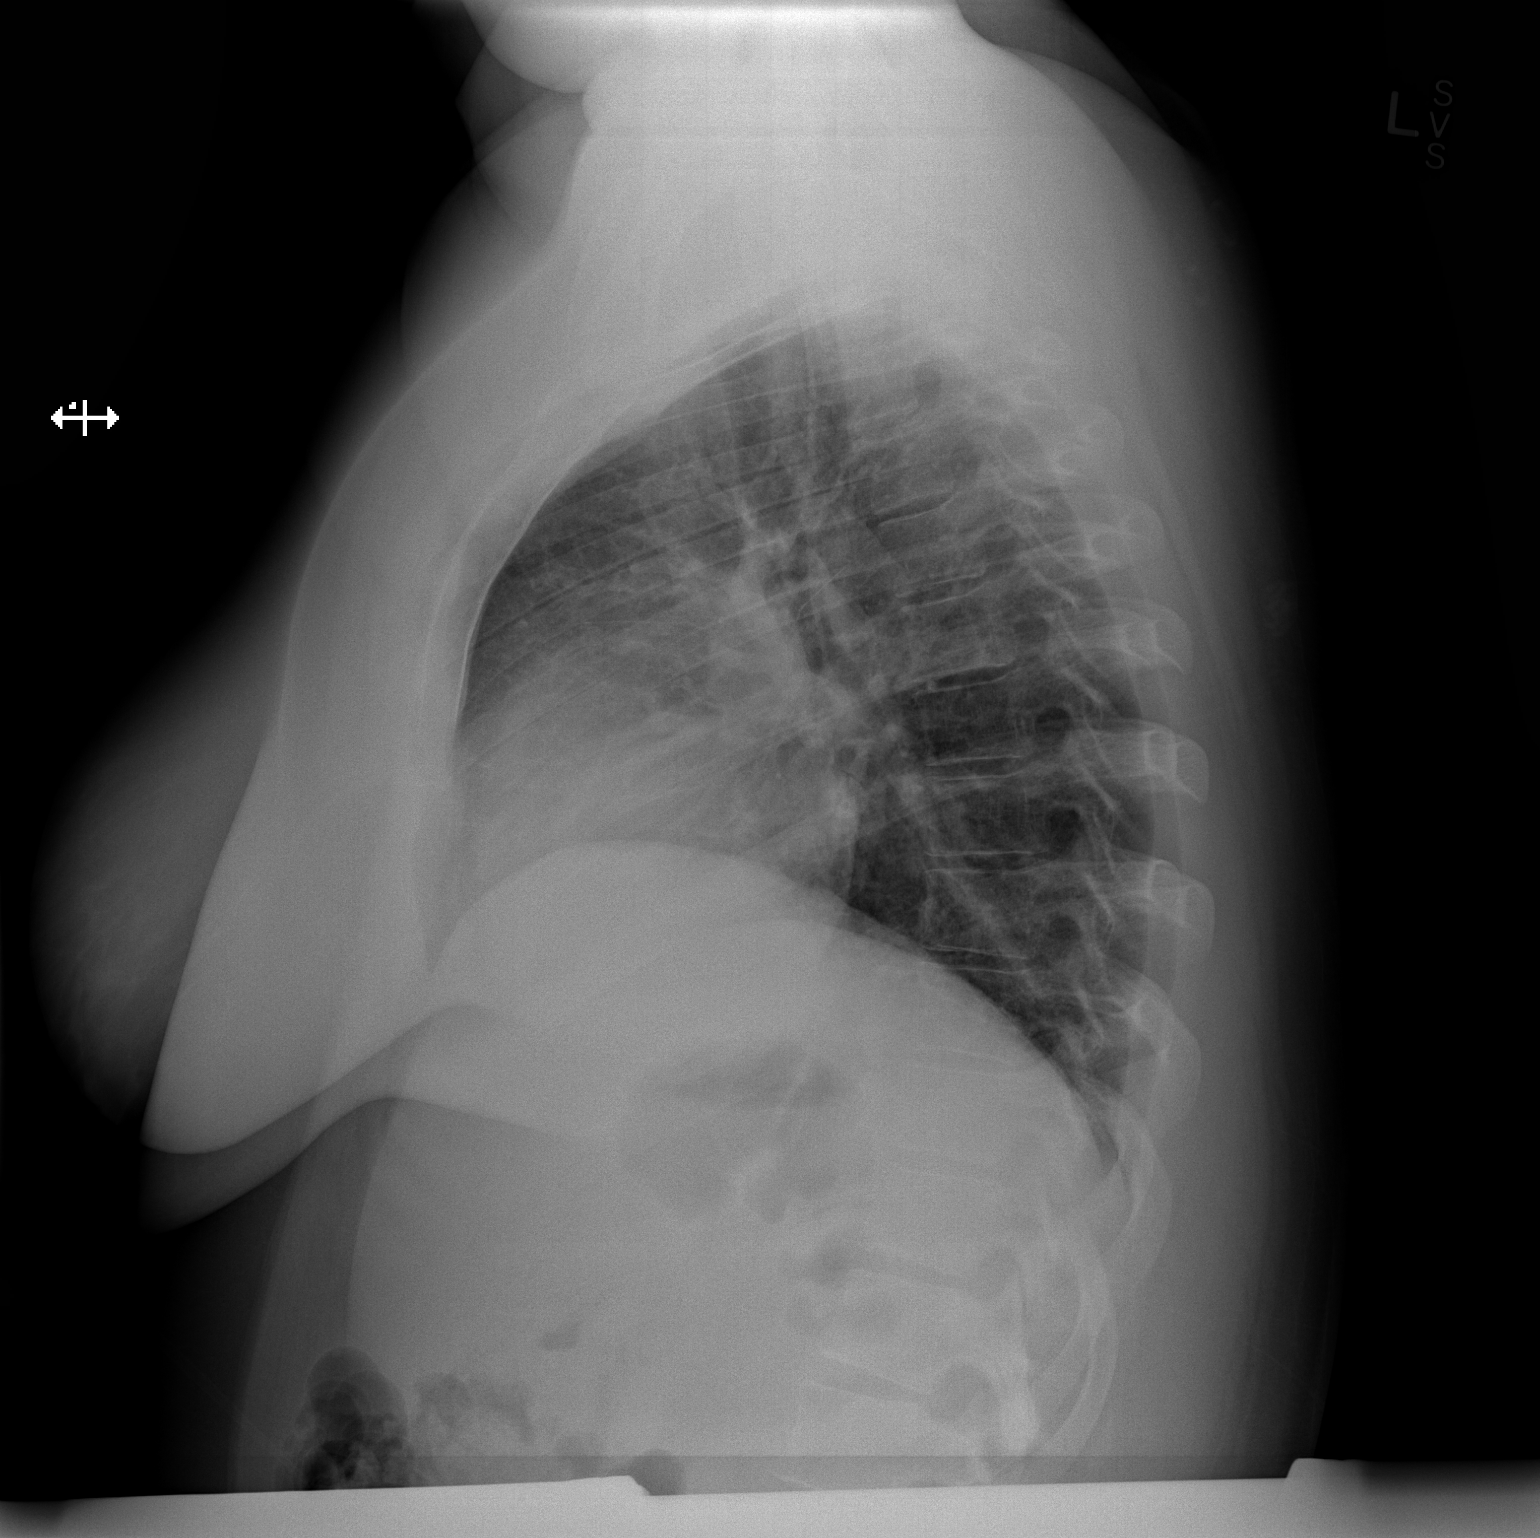

[2 of 2 positions shown; findings below may reference images not displayed]

FINDINGS: Lung volumes are low with minimal basilar atelectasis.
No consolidative process, pneumothorax or effusion.  Heart size is
normal.
IMPRESSION: No acute disease.

## 2014-02-22 ENCOUNTER — Encounter (HOSPITAL_COMMUNITY): Payer: Self-pay | Admitting: Emergency Medicine

## 2014-02-22 ENCOUNTER — Emergency Department (HOSPITAL_COMMUNITY)
Admission: EM | Admit: 2014-02-22 | Discharge: 2014-02-22 | Disposition: A | Discharge status: Home / Self Care | Payer: Medicaid Other | Attending: Emergency Medicine | Admitting: Emergency Medicine

## 2014-02-22 DIAGNOSIS — M545 Low back pain, unspecified: Secondary | ICD-10-CM | POA: Insufficient documentation

## 2014-02-22 DIAGNOSIS — J45909 Unspecified asthma, uncomplicated: Secondary | ICD-10-CM | POA: Diagnosis not present

## 2014-02-22 DIAGNOSIS — Z3202 Encounter for pregnancy test, result negative: Secondary | ICD-10-CM | POA: Insufficient documentation

## 2014-02-22 DIAGNOSIS — R110 Nausea: Secondary | ICD-10-CM | POA: Insufficient documentation

## 2014-02-22 DIAGNOSIS — R1012 Left upper quadrant pain: Secondary | ICD-10-CM | POA: Insufficient documentation

## 2014-02-22 DIAGNOSIS — N390 Urinary tract infection, site not specified: Secondary | ICD-10-CM | POA: Diagnosis not present

## 2014-02-22 DIAGNOSIS — Z79899 Other long term (current) drug therapy: Secondary | ICD-10-CM | POA: Insufficient documentation

## 2014-02-22 LAB — CBC WITH DIFFERENTIAL/PLATELET
Basophils Absolute: 0 10*3/uL (ref 0.0–0.1)
Basophils Relative: 0 % (ref 0–1)
EOS PCT: 1 % (ref 0–5)
Eosinophils Absolute: 0.1 10*3/uL (ref 0.0–0.7)
HEMATOCRIT: 37.4 % (ref 36.0–46.0)
HEMOGLOBIN: 12.2 g/dL (ref 12.0–15.0)
LYMPHS ABS: 3.9 10*3/uL (ref 0.7–4.0)
LYMPHS PCT: 44 % (ref 12–46)
MCH: 29 pg (ref 26.0–34.0)
MCHC: 32.6 g/dL (ref 30.0–36.0)
MCV: 89 fL (ref 78.0–100.0)
MONO ABS: 0.5 10*3/uL (ref 0.1–1.0)
Monocytes Relative: 5 % (ref 3–12)
NEUTROS ABS: 4.3 10*3/uL (ref 1.7–7.7)
Neutrophils Relative %: 50 % (ref 43–77)
Platelets: 193 10*3/uL (ref 150–400)
RBC: 4.2 MIL/uL (ref 3.87–5.11)
RDW: 13.3 % (ref 11.5–15.5)
WBC: 8.8 10*3/uL (ref 4.0–10.5)

## 2014-02-22 LAB — URINE MICROSCOPIC-ADD ON

## 2014-02-22 LAB — URINALYSIS, ROUTINE W REFLEX MICROSCOPIC
BILIRUBIN URINE: NEGATIVE
Glucose, UA: NEGATIVE mg/dL
Hgb urine dipstick: NEGATIVE
KETONES UR: NEGATIVE mg/dL
NITRITE: POSITIVE — AB
PH: 7 (ref 5.0–8.0)
PROTEIN: NEGATIVE mg/dL
Specific Gravity, Urine: 1.027 (ref 1.005–1.030)
Urobilinogen, UA: 1 mg/dL (ref 0.0–1.0)

## 2014-02-22 LAB — COMPREHENSIVE METABOLIC PANEL
ALT: 18 U/L (ref 0–35)
AST: 16 U/L (ref 0–37)
Albumin: 3.7 g/dL (ref 3.5–5.2)
Alkaline Phosphatase: 49 U/L (ref 39–117)
Anion gap: 10 (ref 5–15)
BUN: 14 mg/dL (ref 6–23)
CO2: 25 meq/L (ref 19–32)
CREATININE: 0.71 mg/dL (ref 0.50–1.10)
Calcium: 8.8 mg/dL (ref 8.4–10.5)
Chloride: 105 mEq/L (ref 96–112)
GLUCOSE: 87 mg/dL (ref 70–99)
Potassium: 3.8 mEq/L (ref 3.7–5.3)
Sodium: 140 mEq/L (ref 137–147)
Total Bilirubin: 0.2 mg/dL — ABNORMAL LOW (ref 0.3–1.2)
Total Protein: 8.3 g/dL (ref 6.0–8.3)

## 2014-02-22 LAB — POC URINE PREG, ED: PREG TEST UR: NEGATIVE

## 2014-02-22 MED ORDER — NITROFURANTOIN MONOHYD MACRO 100 MG PO CAPS: 100.0000 mg | ORAL_CAPSULE | Freq: Two times a day (BID) | ORAL | Status: AC

## 2014-02-22 MED ORDER — PHENAZOPYRIDINE HCL 200 MG PO TABS: 200.0000 mg | ORAL_TABLET | Freq: Three times a day (TID) | ORAL | Status: AC

## 2014-02-22 NOTE — ED Notes (Signed)
Pt presents with lower back pain, RUQ pain, dizziness, and headache x2 weeks. Pt reports her dizziness and headaches last approx 10 mins then subside

## 2014-02-22 NOTE — ED Notes (Signed)
Pt did not want to wait any longer, pt encouraged to stay

## 2014-02-22 NOTE — ED Provider Notes (Signed)
CSN: [id number]     Arrival date & time 02/22/14  0021 History   First MD Initiated Contact with Patient 02/22/14 [phone]     Chief Complaint  Patient presents with  . Abdominal Pain  . Back Pain     (Consider location/radiation/quality/duration/timing/severity/associated sxs/prior Treatment) HPI Comments: A 23 yo female presents to the ED with complaints of abdominal pain and back pain for the past two weeks.  The abdominal pain is described as an aching pain in her LUQ.  She has felt nausea, but not vomited.  No diarrhea.  The low back pain feels constant and sore and spans across her back.  The patient also complains of breast tenderness that began two weeks ago and has persisted since.  Admits to some spells of lightheadedness.  States she experiences them 3-4 times a day and they last 10-15 minutes.  Pt is unsure of her LMP.  Complains of light vaginal bleeding 1 week ago and some white vaginal discharge.  Denies dysuria, or gross blood in urine or stool.  Denies fever, chills, or sweats.   Patient is a 23 y.o. female presenting with abdominal pain and back pain. The history is provided by the patient.  Abdominal Pain Associated symptoms: nausea   Associated symptoms: no chest pain, no dysuria, no shortness of breath and no vomiting   Back Pain Associated symptoms: abdominal pain   Associated symptoms: no chest pain, no dysuria and no headaches     Past Medical History  Diagnosis Date  . Asthma    Past Surgical History  Procedure Laterality Date  . Tonsillectomy     History reviewed. No pertinent family history. History  Substance Use Topics  . Smoking status: Never Smoker   . Smokeless tobacco: Not on file  . Alcohol Use: No   OB History   Grav Para Term Preterm Abortions TAB SAB Ect Mult Living                 Review of Systems  Constitutional: Negative for activity change.  Respiratory: Negative for shortness of breath.   Cardiovascular: Negative for chest pain.   Gastrointestinal: Positive for nausea and abdominal pain. Negative for vomiting.  Genitourinary: Negative for dysuria.  Musculoskeletal: Positive for back pain. Negative for neck pain.  Neurological: Negative for headaches.      Allergies  Review of patient's allergies indicates no known allergies.  Home Medications   Prior to Admission medications   Medication Sig Start Date End Date Taking? Authorizing Provider  albuterol (PROVENTIL HFA;VENTOLIN HFA) 108 (90 BASE) MCG/ACT inhaler Inhale 2 puffs into the lungs every 6 (six) hours as needed for wheezing or shortness of breath.    Yes Historical Provider, MD  nitrofurantoin, macrocrystal-monohydrate, (MACROBID) 100 MG capsule Take 1 capsule (100 mg total) by mouth 2 (two) times daily. 02/22/14   Derwood KaplanAnkit Amy Gothard, MD  phenazopyridine (PYRIDIUM) 200 MG tablet Take 1 tablet (200 mg total) by mouth 3 (three) times daily. 02/22/14   Kenyana Husak, MD   BP 115/53  Pulse 69  Temp(Src) 98.6 F (37 C) (Oral)  Resp 13  SpO2 100% Physical Exam  Nursing note and vitals reviewed. Constitutional: She is oriented to person, place, and time. She appears well-developed and well-nourished.  HENT:  Head: Normocephalic and atraumatic.  Eyes: EOM are normal. Pupils are equal, round, and reactive to light.  Neck: Neck supple.  Cardiovascular: Normal rate, regular rhythm and normal heart sounds.   No murmur heard. Pulmonary/Chest: Effort normal.  No respiratory distress.  Abdominal: Soft. She exhibits no distension. There is no tenderness. There is no rebound and no guarding.  Neurological: She is alert and oriented to person, place, and time.  Skin: Skin is warm and dry.    ED Course  Procedures (including critical care time) Labs Review Labs Reviewed  COMPREHENSIVE METABOLIC PANEL - Abnormal; Notable for the following:    Total Bilirubin 0.2 (*)    All other components within normal limits  URINALYSIS, ROUTINE W REFLEX MICROSCOPIC - Abnormal;  Notable for the following:    Nitrite POSITIVE (*)    Leukocytes, UA TRACE (*)    All other components within normal limits  URINE MICROSCOPIC-ADD ON - Abnormal; Notable for the following:    Squamous Epithelial / LPF FEW (*)    Bacteria, UA FEW (*)    All other components within normal limits  CBC WITH DIFFERENTIAL  POC URINE PREG, ED    Imaging Review No results found.   EKG Interpretation None      MDM   Final diagnoses:  UTI (lower urinary tract infection)    Pt with lower back pain and LUQ abd pain. UA shows infection. Likely UTI - doubt pyelo at this time due to lack of fevers, emesis, chills and unilateral pain. Will d.c with macrobid.  Derwood Kaplan, MD 02/22/14 [phone]

## 2014-02-22 NOTE — ED Notes (Signed)
Pt refused e-signature, was halfway out room door when given discharge paperwork

## 2014-02-22 NOTE — Discharge Instructions (Signed)

## 2014-03-01 IMAGING — CR DG CHEST 2V
2 series · 2 of 2 positions shown · non-contrast
Comparison: 10/01/2012

CLINICAL DATA: Follow up pneumonia; history asthma

CHEST - 2 VIEW

[w chest pa]
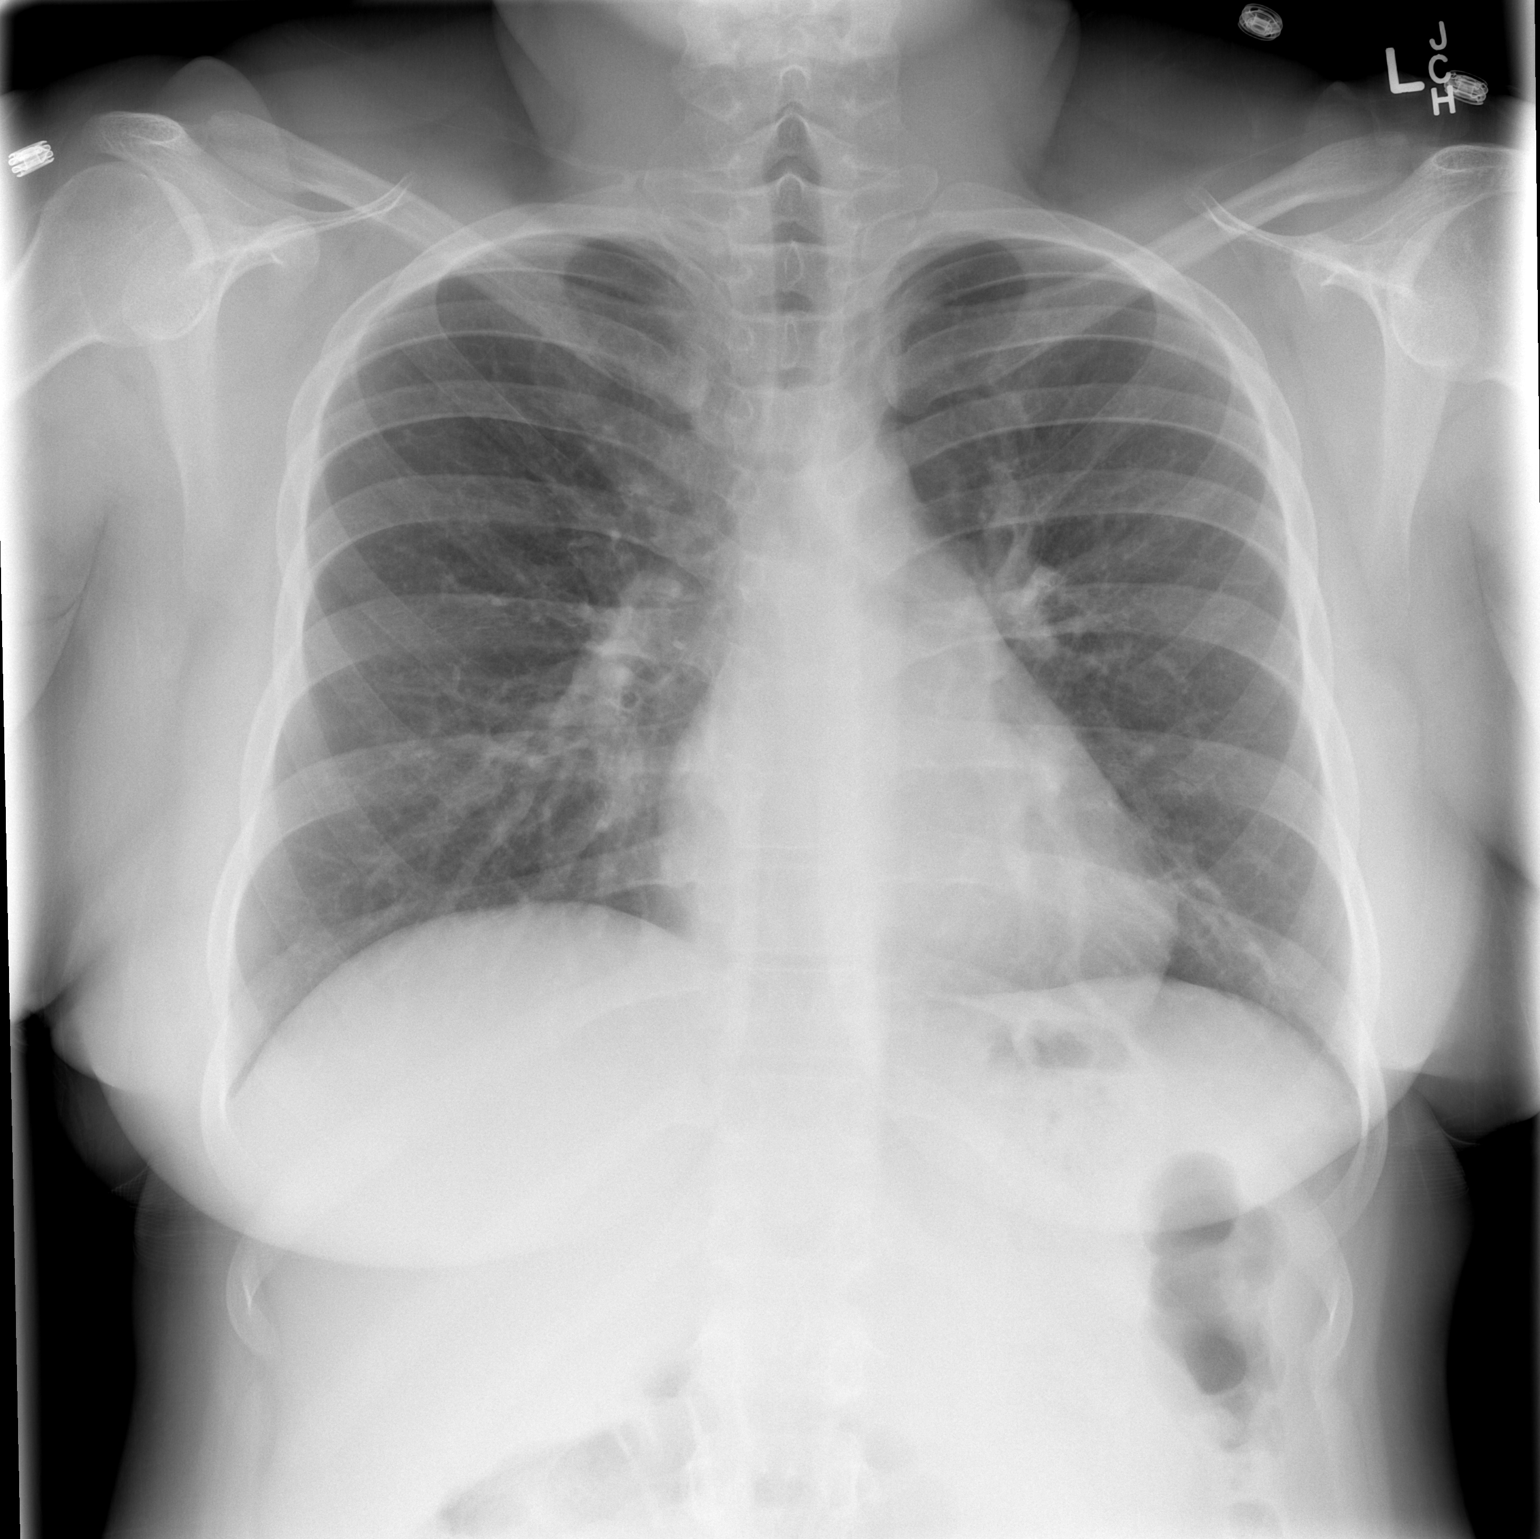

[w chest lat]
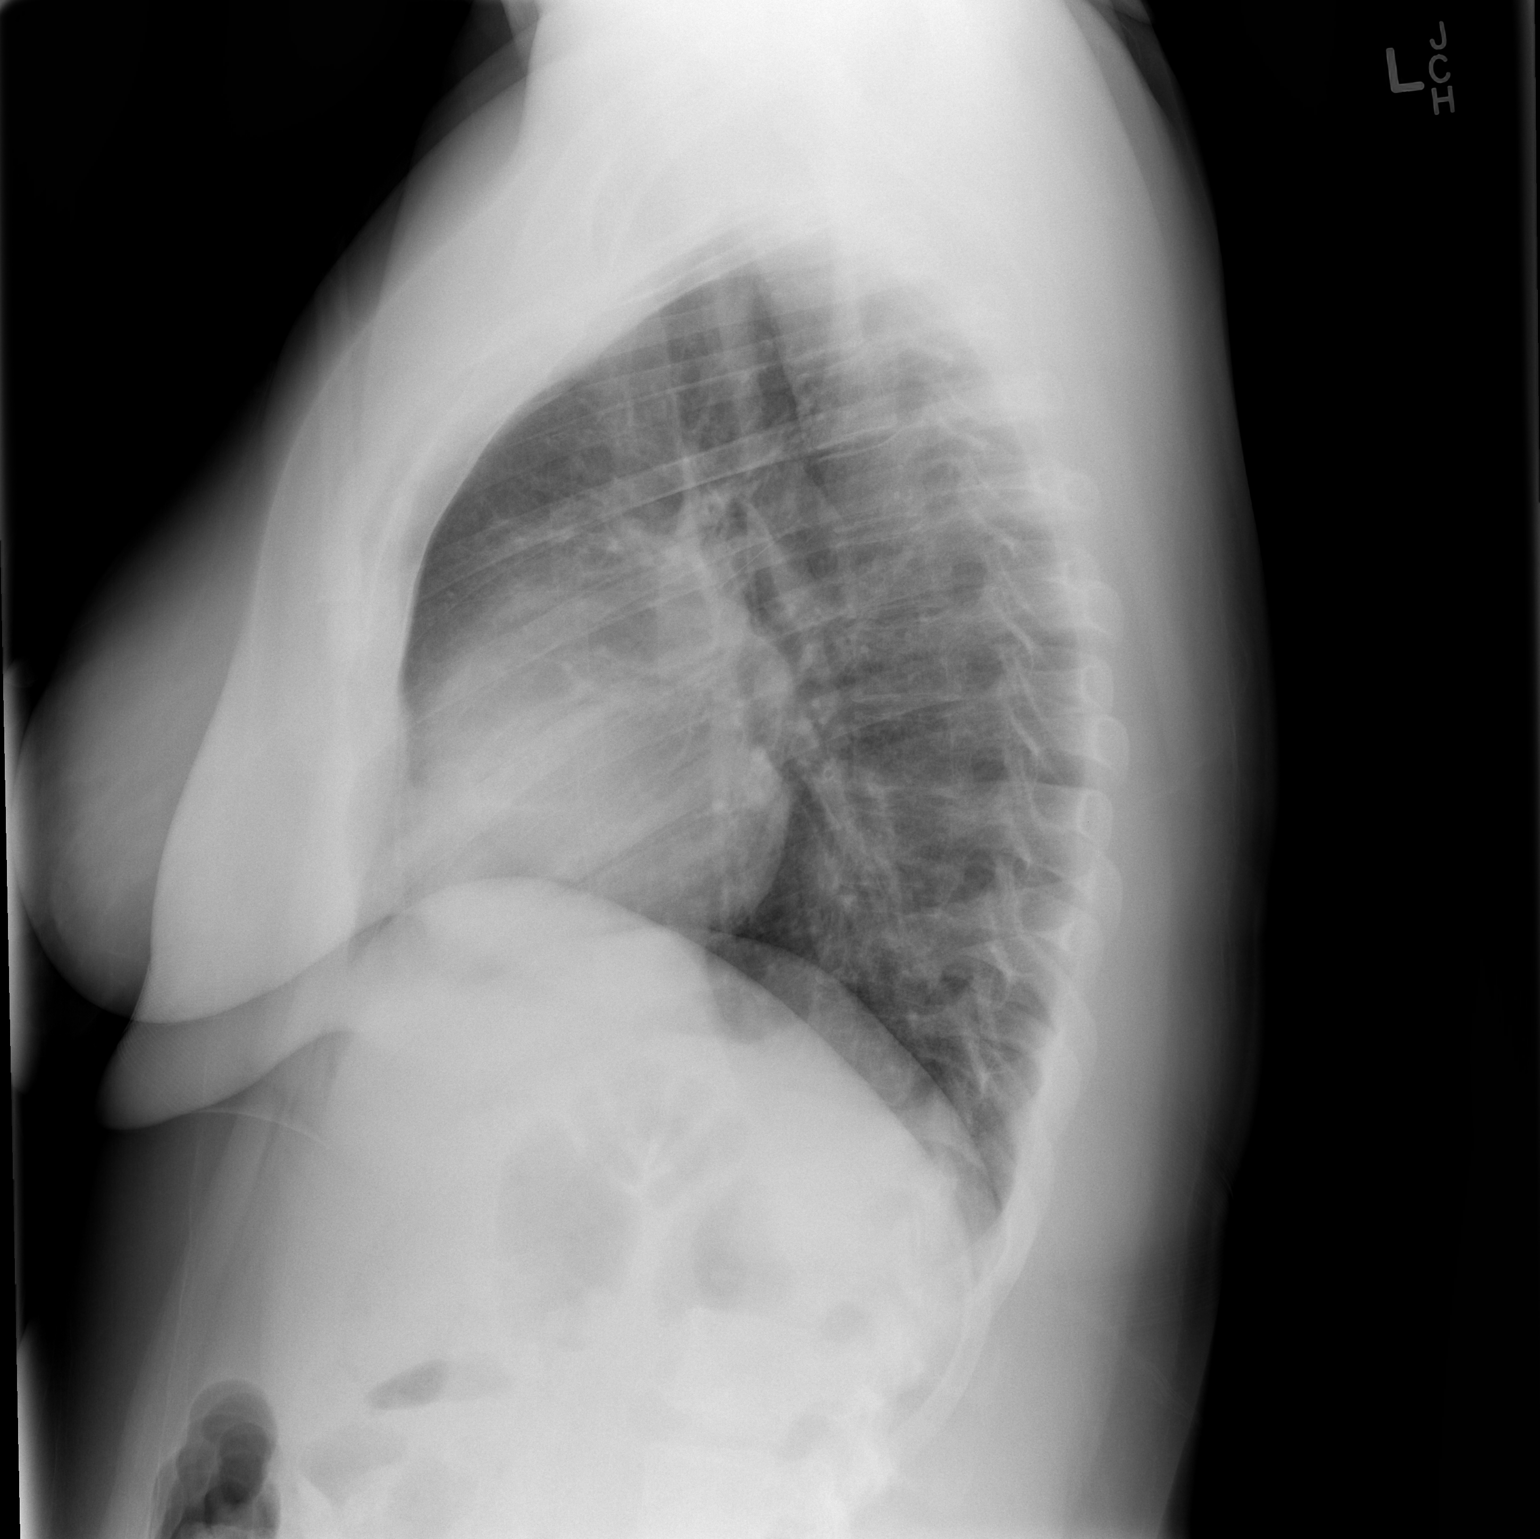

[2 of 2 positions shown; findings below may reference images not displayed]

FINDINGS: Normal heart size, mediastinal contours, and pulmonary vascularity.
Peribronchial thickening with improved right upper lobe infiltrate.
Remaining lungs clear.
No pleural effusion or pneumothorax.
No acute osseous findings.
IMPRESSION: Bronchitic changes.
Improved right upper lobe infiltrate.

## 2014-06-08 ENCOUNTER — Encounter (HOSPITAL_COMMUNITY): Payer: Self-pay

## 2014-06-08 ENCOUNTER — Emergency Department (HOSPITAL_COMMUNITY): Payer: Medicaid Other

## 2014-06-08 ENCOUNTER — Emergency Department (HOSPITAL_COMMUNITY)
Admission: EM | Admit: 2014-06-08 | Discharge: 2014-06-08 | Disposition: A | Discharge status: Home / Self Care | Payer: Self-pay | Attending: Emergency Medicine | Admitting: Emergency Medicine

## 2014-06-08 DIAGNOSIS — R079 Chest pain, unspecified: Secondary | ICD-10-CM

## 2014-06-08 DIAGNOSIS — J45901 Unspecified asthma with (acute) exacerbation: Secondary | ICD-10-CM | POA: Insufficient documentation

## 2014-06-08 DIAGNOSIS — J9801 Acute bronchospasm: Secondary | ICD-10-CM

## 2014-06-08 DIAGNOSIS — Z79899 Other long term (current) drug therapy: Secondary | ICD-10-CM | POA: Insufficient documentation

## 2014-06-08 DIAGNOSIS — J189 Pneumonia, unspecified organism: Secondary | ICD-10-CM

## 2014-06-08 DIAGNOSIS — Z792 Long term (current) use of antibiotics: Secondary | ICD-10-CM | POA: Insufficient documentation

## 2014-06-08 DIAGNOSIS — J159 Unspecified bacterial pneumonia: Secondary | ICD-10-CM | POA: Insufficient documentation

## 2014-06-08 MED ORDER — FLUTICASONE PROPIONATE 50 MCG/ACT NA SUSP: 2.0000 | Freq: Every day | NASAL | Status: AC

## 2014-06-08 MED ORDER — AZITHROMYCIN 250 MG PO TABS: 250.0000 mg | ORAL_TABLET | Freq: Every day | ORAL | Status: AC

## 2014-06-08 MED ORDER — ALBUTEROL SULFATE (2.5 MG/3ML) 0.083% IN NEBU
5.0000 mg | INHALATION_SOLUTION | Freq: Once | RESPIRATORY_TRACT | Status: AC
  Administered 2014-06-08: 5 mg via RESPIRATORY_TRACT
  Filled 2014-06-08: qty 6

## 2014-06-08 MED ORDER — ONDANSETRON 4 MG PO TBDP
4.0000 mg | ORAL_TABLET | Freq: Once | ORAL | Status: AC
  Administered 2014-06-08: 4 mg via ORAL
  Filled 2014-06-08: qty 1

## 2014-06-08 MED ORDER — PREDNISONE 20 MG PO TABS
60.0000 mg | ORAL_TABLET | Freq: Once | ORAL | Status: AC
  Administered 2014-06-08: 60 mg via ORAL
  Filled 2014-06-08: qty 3

## 2014-06-08 MED ORDER — AZITHROMYCIN 250 MG PO TABS
500.0000 mg | ORAL_TABLET | Freq: Once | ORAL | Status: AC
  Administered 2014-06-08: 500 mg via ORAL
  Filled 2014-06-08: qty 2

## 2014-06-08 MED ORDER — ALBUTEROL SULFATE HFA 108 (90 BASE) MCG/ACT IN AERS
2.0000 | INHALATION_SPRAY | Freq: Once | RESPIRATORY_TRACT | Status: AC
  Administered 2014-06-08: 2 via RESPIRATORY_TRACT
  Filled 2014-06-08: qty 6.7

## 2014-06-08 MED ORDER — PREDNISONE 20 MG PO TABS: ORAL_TABLET | ORAL | Status: AC

## 2014-06-08 NOTE — ED Notes (Signed)
Once she was brought back to [facility], pt stated that she is having chest pain.

## 2014-06-08 NOTE — ED Notes (Signed)
PT. REPORTS HAVING RT. SIDE CHEST PAIN WITH COUGH AND CONGESTION , SORE THROAT AND FEVER.  DENIES ANY N/V/D.

## 2014-06-08 NOTE — Discharge Instructions (Signed)
Take azithromycin daily as directed beginning tomorrow as you were given the first dose in the emergency department today. Use albuterol inhaler every 4-6 hours as needed for cough and wheezing. Take prednisone daily beginning tomorrow as you were given the first dose in the emergency department today. Follow-up with your primary care physician or one of the resources below in 5-7 days.  Pneumonia Pneumonia is an infection of the lungs.  CAUSES Pneumonia may be caused by bacteria or a virus. Usually, these infections are caused by breathing infectious particles into the lungs (respiratory tract). SIGNS AND SYMPTOMS   Cough.  Fever.  Chest pain.  Increased rate of breathing.  Wheezing.  Mucus production. DIAGNOSIS  If you have the common symptoms of pneumonia, your health care provider will typically confirm the diagnosis with a chest X-ray. The X-ray will show an abnormality in the lung (pulmonary infiltrate) if you have pneumonia. Other tests of your blood, urine, or sputum may be done to find the specific cause of your pneumonia. Your health care provider may also do tests (blood gases or pulse oximetry) to see how well your lungs are working. TREATMENT  Some forms of pneumonia may be spread to other people when you cough or sneeze. You may be asked to wear a mask before and during your exam. Pneumonia that is caused by bacteria is treated with antibiotic medicine. Pneumonia that is caused by the influenza virus may be treated with an antiviral medicine. Most other viral infections must run their course. These infections will not respond to antibiotics.  HOME CARE INSTRUCTIONS   Cough suppressants may be used if you are losing too much rest. However, coughing protects you by clearing your lungs. You should avoid using cough suppressants if you can.  Your health care provider may have prescribed medicine if he or she thinks your pneumonia is caused by bacteria or influenza. Finish your  medicine even if you start to feel better.  Your health care provider may also prescribe an expectorant. This loosens the mucus to be coughed up.  Take medicines only as directed by your health care provider.  Do not smoke. Smoking is a common cause of bronchitis and can contribute to pneumonia. If you are a smoker and continue to smoke, your cough may last several weeks after your pneumonia has cleared.  A cold steam vaporizer or humidifier in your room or home may help loosen mucus.  Coughing is often worse at night. Sleeping in a semi-upright position in a recliner or using a couple pillows under your head will help with this.  Get rest as you feel it is needed. Your body will usually let you know when you need to rest. PREVENTION A pneumococcal shot (vaccine) is available to prevent a common bacterial cause of pneumonia. This is usually suggested for:  People over 21 years old.  Patients on chemotherapy.  People with chronic lung problems, such as bronchitis or emphysema.  People with immune system problems. If you are over 65 or have a high risk condition, you may receive the pneumococcal vaccine if you have not received it before. In some countries, a routine influenza vaccine is also recommended. This vaccine can help prevent some cases of pneumonia.You may be offered the influenza vaccine as part of your care. If you smoke, it is time to quit. You may receive instructions on how to stop smoking. Your health care provider can provide medicines and counseling to help you quit. SEEK MEDICAL CARE IF: You  have a fever. SEEK IMMEDIATE MEDICAL CARE IF:   Your illness becomes worse. This is especially true if you are elderly or weakened from any other disease.  You cannot control your cough with suppressants and are losing sleep.  You begin coughing up blood.  You develop pain which is getting worse or is uncontrolled with medicines.  Any of the symptoms which initially brought  you in for treatment are getting worse rather than better.  You develop shortness of breath or chest pain. MAKE SURE YOU:   Understand these instructions.  Will watch your condition.  Will get help right away if you are not doing well or get worse. Document Released: 07/18/2005 Document Revised: 12/02/2013 Document Reviewed: 10/07/2010 [facility] Patient Information 2015 [employer], [place]. This information is not intended to replace advice given to you by your health care provider. Make sure you discuss any questions you have with your health care provider.  Bronchospasm A bronchospasm is a spasm or tightening of the airways going into the lungs. During a bronchospasm breathing becomes more difficult because the airways get smaller. When this happens there can be coughing, a whistling sound when breathing (wheezing), and difficulty breathing. Bronchospasm is often associated with asthma, but not all patients who experience a bronchospasm have asthma. CAUSES  A bronchospasm is caused by inflammation or irritation of the airways. The inflammation or irritation may be triggered by:   Allergies (such as to animals, pollen, food, or mold). Allergens that cause bronchospasm may cause wheezing immediately after exposure or many hours later.   Infection. Viral infections are believed to be the most common cause of bronchospasm.   Exercise.   Irritants (such as pollution, cigarette smoke, strong odors, aerosol sprays, and paint fumes).   Weather changes. Winds increase molds and pollens in the air. Rain refreshes the air by washing irritants out. Cold air may cause inflammation.   Stress and emotional upset.  SIGNS AND SYMPTOMS   Wheezing.   Excessive nighttime coughing.   Frequent or severe coughing with a simple cold.   Chest tightness.   Shortness of breath.  DIAGNOSIS  Bronchospasm is usually diagnosed through a history and physical exam. Tests, such as chest X-rays, are  sometimes done to look for other conditions. TREATMENT   Inhaled medicines can be given to open up your airways and help you breathe. The medicines can be given using either an inhaler or a nebulizer machine.  Corticosteroid medicines may be given for severe bronchospasm, usually when it is associated with asthma. HOME CARE INSTRUCTIONS   Always have a plan prepared for seeking medical care. Know when to call your health care provider and local emergency services (911 in the [place] Know where you can access local emergency care.  Only take medicines as directed by your health care provider.  If you were prescribed an inhaler or nebulizer machine, ask your health care provider to explain how to use it correctly. Always use a spacer with your inhaler if you were given one.  It is necessary to remain calm during an attack. Try to relax and breathe more slowly.  Control your home environment in the following ways:   Change your heating and air conditioning filter at least once a month.   Limit your use of fireplaces and wood stoves.  Do not smoke and do not allow smoking in your home.   Avoid exposure to perfumes and fragrances.   Get rid of pests (such as roaches and mice) and their droppings.  Throw away plants if you see mold on them.   Keep your house clean and dust free.   Replace carpet with wood, tile, or vinyl flooring. Carpet can trap dander and dust.   Use allergy-proof pillows, mattress covers, and box spring covers.   Wash bed sheets and blankets every week in hot water and dry them in a dryer.   Use blankets that are made of polyester or cotton.   Wash hands frequently. SEEK MEDICAL CARE IF:   You have muscle aches.   You have chest pain.   The sputum changes from clear or white to yellow, green, gray, or bloody.   The sputum you cough up gets thicker.   There are problems that may be related to the medicine you are given, such as a rash,  itching, swelling, or trouble breathing.  SEEK IMMEDIATE MEDICAL CARE IF:   You have worsening wheezing and coughing even after taking your prescribed medicines.   You have increased difficulty breathing.   You develop severe chest pain. MAKE SURE YOU:   Understand these instructions.  Will watch your condition.  Will get help right away if you are not doing well or get worse. Document Released: 07/21/2003 Document Revised: 07/23/2013 Document Reviewed: 01/07/2013 [facility] Patient Information 2015 [place], [place]. This information is not intended to replace advice given to you by your health care provider. Make sure you discuss any questions you have with your health care provider.

## 2014-06-08 NOTE — ED Provider Notes (Signed)
CSN: [id number]     Arrival date & time 06/08/14  0932 History   First MD Initiated Contact with Patient 06/08/14 [phone]     Chief Complaint  Patient presents with  . URI     (Consider location/radiation/quality/duration/timing/severity/associated sxs/prior Treatment) HPI Comments: This is a 23 year old female with a past medical history of asthma who presents to the emergency department complaining of nasal congestion, chest congestion, chills and generalized body aches 1 week. Admits to associated nonproductive cough and chest tightness. States over the past few days she has been experiencing right-sided chest pain. No aggravating or alleviating factors. She has tried taking multiple over-the-counter medications including Tylenol Cold and flu, Alka-Seltzer with no relief. She used her albuterol inhaler, 2 puffs earlier this morning with minimal relief. States these were her last 2 puffs of her inhaler. States her last asthma exacerbation was over a year ago. Denies fevers, shortness of breath or vomiting. Mild nausea. She states she has a lot of pressure behind her ears, right more so than left and pressure behind her sinuses.  The history is provided by the patient.    Past Medical History  Diagnosis Date  . Asthma    Past Surgical History  Procedure Laterality Date  . Tonsillectomy     No family history on file. History  Substance Use Topics  . Smoking status: Never Smoker   . Smokeless tobacco: Not on file  . Alcohol Use: No   OB History    No data available     Review of Systems  10 Systems reviewed and are negative for acute change except as noted in the HPI.  Allergies  Review of patient's allergies indicates no known allergies.  Home Medications   Prior to Admission medications   Medication Sig Start Date End Date Taking? Authorizing Provider  albuterol (PROVENTIL HFA;VENTOLIN HFA) 108 (90 BASE) MCG/ACT inhaler Inhale 2 puffs into the lungs every 6 (six) hours as  needed for wheezing or shortness of breath.    Yes Historical Provider, MD  Phenylephrine-DM-GG-APAP (TYLENOL COLD/FLU SEVERE PO) Take 2 tablets by mouth every 4 (four) hours as needed (cold symptoms).   Yes Historical Provider, MD  pseudoephedrine (SUDAFED) 30 MG tablet Take 30 mg by mouth every 4 (four) hours as needed for congestion.   Yes Historical Provider, MD  azithromycin (ZITHROMAX) 250 MG tablet Take 1 tablet (250 mg total) by mouth daily. Take 1 tab daily x 4 days 06/08/14   Kathrynn Speedobyn M Ramey Ketcherside, PA-C  fluticasone [facility](FLONASE) 50 MCG/ACT nasal spray Place 2 sprays into both nostrils daily. 06/08/14   Yisroel Mullendore M Noha Milberger, PA-C  nitrofurantoin, macrocrystal-monohydrate, (MACROBID) 100 MG capsule Take 1 capsule (100 mg total) by mouth 2 (two) times daily. 02/22/14   Derwood KaplanAnkit Nanavati, MD  phenazopyridine (PYRIDIUM) 200 MG tablet Take 1 tablet (200 mg total) by mouth 3 (three) times daily. 02/22/14   Derwood KaplanAnkit Nanavati, MD  predniSONE (DELTASONE) 20 MG tablet 2 tabs po daily x 4 days 06/08/14   Adrienne Trombetta M Joeann Steppe, PA-C   BP 105/62 mmHg  Pulse 96  Temp(Src) 98 F (36.7 C) (Oral)  Resp 15  SpO2 98%  LMP  Physical Exam  Constitutional: She is oriented to person, place, and time. She appears well-developed and well-nourished. No distress.  HENT:  Head: Normocephalic and atraumatic.  Nasal congestion, mucosal edema. Post nasal drip. Post-oropharyngeal erythema without edema or exudate. Bilateral frontal and maxillary sinus tenderness. Bilateral TM retracted, no erythema or injection.  Eyes: Conjunctivae and EOM  are normal.  Neck: Normal range of motion. Neck supple.  Cardiovascular: Normal rate, regular rhythm and normal heart sounds.   Pulmonary/Chest: Effort normal and breath sounds normal. No respiratory distress.  Diffuse inspiratory and expiratory wheezes bilateral.  Musculoskeletal: Normal range of motion. She exhibits no edema.  Neurological: She is alert and oriented to person, place, and time. No sensory  deficit.  Skin: Skin is warm and dry. She is not diaphoretic.  Psychiatric: She has a normal mood and affect. Her behavior is normal.  Nursing note and vitals reviewed.   ED Course  Procedures (including critical care time) Labs Review Labs Reviewed - No data to display  Imaging Review Dg Chest 2 View  06/08/2014   CLINICAL DATA:  23 year old female with right-sided chest pain and cough x1 week  EXAM: CHEST  2 VIEW  COMPARISON:  prior chest x-ray 10/08/2012  FINDINGS: Cardiac and mediastinal contours remain within normal limits. Inspiratory volumes are low and there is evidence of linear opacities in the right perihilar region and bilateral bases concerning for subsegmental atelectasis. Faint patchy opacity in the right lung base concerning for early bronchopneumonia. No pleural effusion or pneumothorax. No acute osseous abnormality.  IMPRESSION: 1. Same patchy opacity in the right lower lobe concerning for early pneumonia. 2. Additional linear opacities in the right perihilar region and left base favored to reflect subsegmental atelectasis. Additional regions of infiltrate not excluded.   Electronically Signed   By: Malachy MoanHeath  McCullough M.D.   On: 06/08/2014 10:56     EKG Interpretation   Date/Time:  Sunday June 08 2014 09:41:34 EST Ventricular Rate:  94 PR Interval:  152 QRS Duration: 97 QT Interval:  374 QTC Calculation: 468 R Axis:   89 Text Interpretation:  Sinus rhythm no previous for comparison Confirmed by  HARRISON  MD, FORREST ([id number]) on 06/08/2014 10:08:52 AM      MDM   Final diagnoses:  Chest pain  CAP (community acquired pneumonia)  Bronchospasm   Patient presenting with congestion and chest pain. She is nontoxic appearing and in no apparent distress. Afebrile, vital signs stable. O2 sat 97% on room air. Lung sounds with diffuse wheezes bilateral. Given isolated right-sided chest pain with cough and wheezing, chest x-ray ordered. [place] in the right lower  lobe concerning for early pneumonia noted with additional linear opaque disease in the right perihilar region and left base favored to reflect subsegmental atelectasis. Patient given albuterol nebulizer treatment along with oral steroids with some improvement. On repeat lung examination, breath sounds still with wheezes, however improved from initial exam. Patient's vitals remained stable throughout encounter, O2 sat remains between 97 and 98%. First dose of azithromycin given in the emergency department along with first dose of steroids. She will be discharged home with short course of prednisone along with albuterol inhaler and azithromycin. Stable for discharge. Return precautions given. Patient states understanding of treatment care plan and is agreeable.    Kathrynn SpeedRobyn M Lynnsie Linders, PA-C 06/08/14 1114  Purvis SheffieldForrest Harrison, MD 06/08/14 [phone]
